# Patient Record
Sex: Male | Born: 1940 | Race: Black or African American | Hispanic: No | Marital: Single | Smoking: Former smoker
Health system: Southern US, Community
[De-identification: ages and names within clinical notes are randomized; demographics above are authoritative.]

## PROBLEM LIST (undated history)

## (undated) DIAGNOSIS — N289 Disorder of kidney and ureter, unspecified: Secondary | ICD-10-CM

## (undated) DIAGNOSIS — Z7289 Other problems related to lifestyle: Secondary | ICD-10-CM

## (undated) DIAGNOSIS — K922 Gastrointestinal hemorrhage, unspecified: Secondary | ICD-10-CM

## (undated) DIAGNOSIS — Z789 Other specified health status: Secondary | ICD-10-CM

## (undated) DIAGNOSIS — E7800 Pure hypercholesterolemia, unspecified: Secondary | ICD-10-CM

## (undated) DIAGNOSIS — R001 Bradycardia, unspecified: Secondary | ICD-10-CM

## (undated) DIAGNOSIS — F1090 Alcohol use, unspecified, uncomplicated: Secondary | ICD-10-CM

## (undated) DIAGNOSIS — I10 Essential (primary) hypertension: Secondary | ICD-10-CM

## (undated) DIAGNOSIS — D126 Benign neoplasm of colon, unspecified: Secondary | ICD-10-CM

## (undated) DIAGNOSIS — I639 Cerebral infarction, unspecified: Secondary | ICD-10-CM

## (undated) DIAGNOSIS — R55 Syncope and collapse: Secondary | ICD-10-CM

## (undated) HISTORY — DX: Essential (primary) hypertension: I10

## (undated) HISTORY — PX: OTHER SURGICAL HISTORY: SHX169

## (undated) HISTORY — DX: Pure hypercholesterolemia, unspecified: E78.00

## (undated) HISTORY — DX: Cerebral infarction, unspecified: I63.9

## (undated) HISTORY — DX: Benign neoplasm of colon, unspecified: D12.6

---

## 1998-05-09 ENCOUNTER — Emergency Department (HOSPITAL_COMMUNITY): Admission: EM | Admit: 1998-05-09 | Discharge: 1998-05-09 | Payer: Self-pay | Admitting: Emergency Medicine

## 1998-05-17 ENCOUNTER — Emergency Department (HOSPITAL_COMMUNITY): Admission: EM | Admit: 1998-05-17 | Discharge: 1998-05-17 | Payer: Self-pay | Admitting: Emergency Medicine

## 1998-07-19 ENCOUNTER — Ambulatory Visit (HOSPITAL_COMMUNITY): Admission: RE | Admit: 1998-07-19 | Discharge: 1998-07-19 | Payer: Self-pay | Admitting: Family Medicine

## 1999-11-07 HISTORY — PX: OTHER SURGICAL HISTORY: SHX169

## 2001-09-19 ENCOUNTER — Emergency Department (HOSPITAL_COMMUNITY): Admission: EM | Admit: 2001-09-19 | Discharge: 2001-09-19 | Payer: Self-pay | Admitting: Emergency Medicine

## 2001-09-19 ENCOUNTER — Encounter: Payer: Self-pay | Admitting: Emergency Medicine

## 2001-09-25 ENCOUNTER — Emergency Department (HOSPITAL_COMMUNITY): Admission: EM | Admit: 2001-09-25 | Discharge: 2001-09-25 | Payer: Self-pay

## 2001-10-24 ENCOUNTER — Ambulatory Visit (HOSPITAL_COMMUNITY): Admission: RE | Admit: 2001-10-24 | Discharge: 2001-10-24 | Payer: Self-pay | Admitting: Family Medicine

## 2001-12-31 ENCOUNTER — Ambulatory Visit (HOSPITAL_COMMUNITY): Admission: RE | Admit: 2001-12-31 | Discharge: 2001-12-31 | Payer: Self-pay | Admitting: Family Medicine

## 2002-04-11 ENCOUNTER — Encounter: Payer: Self-pay | Admitting: Emergency Medicine

## 2002-04-11 ENCOUNTER — Emergency Department (HOSPITAL_COMMUNITY): Admission: EM | Admit: 2002-04-11 | Discharge: 2002-04-11 | Payer: Self-pay | Admitting: Emergency Medicine

## 2002-04-20 ENCOUNTER — Emergency Department (HOSPITAL_COMMUNITY): Admission: EM | Admit: 2002-04-20 | Discharge: 2002-04-20 | Payer: Self-pay | Admitting: Emergency Medicine

## 2002-06-26 ENCOUNTER — Encounter: Payer: Self-pay | Admitting: Internal Medicine

## 2002-06-26 ENCOUNTER — Encounter: Payer: Self-pay | Admitting: Emergency Medicine

## 2002-06-26 ENCOUNTER — Inpatient Hospital Stay (HOSPITAL_COMMUNITY): Admission: EM | Admit: 2002-06-26 | Discharge: 2002-06-29 | Payer: Self-pay | Admitting: Emergency Medicine

## 2002-06-28 ENCOUNTER — Encounter: Payer: Self-pay | Admitting: Internal Medicine

## 2004-01-09 ENCOUNTER — Emergency Department (HOSPITAL_COMMUNITY): Admission: EM | Admit: 2004-01-09 | Discharge: 2004-01-09 | Payer: Self-pay | Admitting: Emergency Medicine

## 2004-06-08 ENCOUNTER — Emergency Department (HOSPITAL_COMMUNITY): Admission: EM | Admit: 2004-06-08 | Discharge: 2004-06-09 | Payer: Self-pay | Admitting: Emergency Medicine

## 2004-12-01 ENCOUNTER — Emergency Department (HOSPITAL_COMMUNITY): Admission: EM | Admit: 2004-12-01 | Discharge: 2004-12-01 | Payer: Self-pay | Admitting: Emergency Medicine

## 2006-09-09 ENCOUNTER — Emergency Department (HOSPITAL_COMMUNITY): Admission: EM | Admit: 2006-09-09 | Discharge: 2006-09-10 | Payer: Self-pay | Admitting: Podiatry

## 2006-12-14 ENCOUNTER — Emergency Department (HOSPITAL_COMMUNITY): Admission: EM | Admit: 2006-12-14 | Discharge: 2006-12-14 | Payer: Self-pay | Admitting: Emergency Medicine

## 2009-06-06 DIAGNOSIS — R001 Bradycardia, unspecified: Secondary | ICD-10-CM

## 2009-06-06 HISTORY — DX: Bradycardia, unspecified: R00.1

## 2009-06-09 ENCOUNTER — Inpatient Hospital Stay (HOSPITAL_COMMUNITY): Admission: EM | Admit: 2009-06-09 | Discharge: 2009-06-24 | Payer: Self-pay | Admitting: Emergency Medicine

## 2009-06-09 ENCOUNTER — Ambulatory Visit: Payer: Self-pay | Admitting: Infectious Disease

## 2009-06-10 ENCOUNTER — Encounter: Payer: Self-pay | Admitting: Infectious Disease

## 2009-06-10 ENCOUNTER — Ambulatory Visit: Payer: Self-pay | Admitting: Vascular Surgery

## 2009-06-23 ENCOUNTER — Ambulatory Visit: Payer: Self-pay | Admitting: Gastroenterology

## 2009-06-23 ENCOUNTER — Encounter (INDEPENDENT_AMBULATORY_CARE_PROVIDER_SITE_OTHER): Payer: Self-pay | Admitting: *Deleted

## 2009-08-09 ENCOUNTER — Encounter (INDEPENDENT_AMBULATORY_CARE_PROVIDER_SITE_OTHER): Payer: Self-pay | Admitting: *Deleted

## 2009-09-02 ENCOUNTER — Encounter (INDEPENDENT_AMBULATORY_CARE_PROVIDER_SITE_OTHER): Payer: Self-pay | Admitting: *Deleted

## 2009-09-02 ENCOUNTER — Ambulatory Visit: Payer: Self-pay | Admitting: Gastroenterology

## 2009-09-02 DIAGNOSIS — K219 Gastro-esophageal reflux disease without esophagitis: Secondary | ICD-10-CM | POA: Insufficient documentation

## 2009-09-02 DIAGNOSIS — K921 Melena: Secondary | ICD-10-CM

## 2009-09-02 DIAGNOSIS — I69959 Hemiplegia and hemiparesis following unspecified cerebrovascular disease affecting unspecified side: Secondary | ICD-10-CM | POA: Insufficient documentation

## 2009-09-06 ENCOUNTER — Telehealth: Payer: Self-pay | Admitting: Gastroenterology

## 2009-09-06 ENCOUNTER — Ambulatory Visit (HOSPITAL_COMMUNITY): Admission: RE | Admit: 2009-09-06 | Discharge: 2009-09-06 | Payer: Self-pay | Admitting: Gastroenterology

## 2009-09-06 ENCOUNTER — Ambulatory Visit: Payer: Self-pay | Admitting: Gastroenterology

## 2009-09-06 ENCOUNTER — Encounter: Payer: Self-pay | Admitting: Gastroenterology

## 2009-09-08 ENCOUNTER — Encounter: Payer: Self-pay | Admitting: Gastroenterology

## 2009-10-15 ENCOUNTER — Encounter (INDEPENDENT_AMBULATORY_CARE_PROVIDER_SITE_OTHER): Payer: Self-pay

## 2009-10-19 ENCOUNTER — Ambulatory Visit (HOSPITAL_COMMUNITY): Admission: RE | Admit: 2009-10-19 | Discharge: 2009-10-19 | Payer: Self-pay | Admitting: Gastroenterology

## 2009-10-19 ENCOUNTER — Telehealth (INDEPENDENT_AMBULATORY_CARE_PROVIDER_SITE_OTHER): Payer: Self-pay

## 2009-10-19 ENCOUNTER — Encounter (INDEPENDENT_AMBULATORY_CARE_PROVIDER_SITE_OTHER): Payer: Self-pay

## 2009-11-06 DIAGNOSIS — D126 Benign neoplasm of colon, unspecified: Secondary | ICD-10-CM

## 2009-11-06 HISTORY — DX: Benign neoplasm of colon, unspecified: D12.6

## 2009-11-29 ENCOUNTER — Ambulatory Visit: Payer: Self-pay | Admitting: Gastroenterology

## 2009-11-29 ENCOUNTER — Ambulatory Visit (HOSPITAL_COMMUNITY): Admission: RE | Admit: 2009-11-29 | Discharge: 2009-11-29 | Payer: Self-pay | Admitting: Gastroenterology

## 2009-11-30 ENCOUNTER — Encounter: Payer: Self-pay | Admitting: Gastroenterology

## 2010-12-06 NOTE — Procedures (Signed)
Summary: Colonoscopy  Patient: Isaiah [name] Note: All result statuses are Final unless otherwise noted.  Tests: (1) Colonoscopy (COL)   COL Colonoscopy           DONE     [facility]     [address] [place], [place]  [address]           COLONOSCOPY PROCEDURE REPORT           PATIENT:  Isaiah [name], Isaiah [name]  MR#:  [id number]     BIRTHDATE:  16-Aug-1941, 68 yrs. old  GENDER:  male           ENDOSCOPIST:  Judie Petit T. Russella Dar, MD, [facility]     Referred by:  Baltazar Najjar, M.D.           PROCEDURE DATE:  11/29/2009     PROCEDURE:  Colonoscopy with biopsy and snare polypectomy and  w/     submucosal injection     ASA CLASS:  Class II     INDICATIONS:  1) hematochezia           MEDICATIONS:   Fentanyl 75 mcg IV, Versed 5 mg IV           DESCRIPTION OF PROCEDURE:   After the risks benefits and     alternatives of the procedure were thoroughly explained, informed     consent was obtained.  Digital rectal exam was performed and     revealed no abnormalities.  The Pentax Colonoscope [id number]     endoscope was introduced through the anus and advanced to the     cecum, which was identified by both the appendix and ileocecal     valve, limited by poor preparation. Extensive risning and     suctioning was performed on insertion and withdrawl. The quality     of the prep was poor, using MoviPrep. The instrument was then     slowly withdrawn as the colon was fully examined.     <<PROCEDUREIMAGES>>           FINDINGS:  A sessile polyp was found in the cecum. It was 5 mm in     size. Polyp was snared without cautery. Retrieval was successful.     Four polyps were found in the ascending colon. They were 6 - 10 mm     in size. Polyps were snared without cautery. Retrieval was     successful.  The 2 smaller ascending polyps were lost in stool.  A     sessile polyp was found in the proximal transverse colon. It was     firm. It was 10 mm in size. Polyp was snared, then cauterized with  monopolar cautery. Retrieval was successful. A sessile polyp was     found in the descending colon. It was 7 mm in size. Polyp was     snared, then cauterized with monopolar cautery. Retrieval was     successful. Normal colon mucosa in the proximal transverse colon.     Submucosal injection of [place] ink. Tattoo of transverse colon in 2     locations adjacent to the polyp site. Nodular mucosa was found in     the rectum. It was friable and erythematous. Multiple biopsies     were obtained and sent to pathology. R/O mucosal prolaspe. This     was otherwise a normal examination of the colon.   Retroflexed     views in the rectum revealed no abnormalities.  The time to cecum     =  20  minutes. The scope was then withdrawn (time =  42  min)     from the patient and the procedure completed.           COMPLICATIONS:  None           ENDOSCOPIC IMPRESSION:     1) 5 mm sessile polyp in the cecum     2) 6 - 10 mm Four polyps in the ascending colon     3) 10 mm firm sessile polyp in the proximal transverse colon     4) 7 mm sessile polyp in the descending colon     5) Nodular musoca in the rectum. R/O mucosal prolapse           RECOMMENDATIONS:     1) No aspirin or NSAID's for 2 weeks     2) Await pathology results     3) Repeat Colonoscopy in 1-2 years, pending pathology review and     pts health status, with a more extensive bowel preparation.     4) Constipation regimen           Tallia Moehring T. Russella Dar, MD, [facility]           n.     eSIGNED:   Venita Lick. Jamell Opfer at 11/29/2009 10:22 AM           Westley Hummer, [id number]  Note: An exclamation mark (!) indicates a result that was not dispersed into the flowsheet. Document Creation Date: 11/29/2009 10:22 AM _______________________________________________________________________  (1) Order result status: Final Collection or observation date-time: 11/29/2009 10:09 Requested date-time:  Receipt date-time:  Reported date-time:  Referring  Physician:   Ordering Physician: Claudette Head [phone]) Specimen Source:  Source: Launa Grill Order Number: [id number] Lab site:

## 2010-12-06 NOTE — Letter (Signed)
Summary: Patient Notice- Polyp Results  [facility]  [address] [place], [place] [address]   Phone: [phone]  Fax: [phone]        November 30, 2009 MRN: [id number]    [facility] [address] [place], [place]  [address]    Dear Isaiah [name],  I am pleased to inform you that the colon polyp(s) removed during your recent colonoscopy was (were) found to be benign (no cancer detected) upon pathologic examination.   I recommend you have a repeat colonoscopy examination in 2 years to look for recurrent polyps, as having colon polyps increases your risk for having recurrent polyps or even colon cancer in the future.  Should you develop new or worsening symptoms of abdominal pain, bowel habit changes or bleeding from the rectum or bowels, please schedule an evaluation with either your primary care physician or with me.  Continue treatment plan as outlined the day of your exam.  Please call us if you are having persistent problems or have questions about your condition that have not been fully answered at this time.  Sincerely,  Isaiah Dare MD [facility]  This letter has been electronically signed by your physician.  Appended Document: Patient Notice- Polyp Results letter mailed to patient's home

## 2011-02-11 LAB — GLUCOSE, CAPILLARY
Glucose-Capillary: 134 mg/dL — ABNORMAL HIGH (ref 70–99)
Glucose-Capillary: 111 mg/dL — ABNORMAL HIGH (ref 70–99)
Glucose-Capillary: 111 mg/dL — ABNORMAL HIGH (ref 70–99)
Glucose-Capillary: 71 mg/dL (ref 70–99)
Glucose-Capillary: 79 mg/dL (ref 70–99)
Glucose-Capillary: 80 mg/dL (ref 70–99)
Glucose-Capillary: 82 mg/dL (ref 70–99)
Glucose-Capillary: 92 mg/dL (ref 70–99)
Glucose-Capillary: 93 mg/dL (ref 70–99)
Glucose-Capillary: 96 mg/dL (ref 70–99)

## 2011-02-11 LAB — BASIC METABOLIC PANEL
BUN: 4 mg/dL — ABNORMAL LOW (ref 6–23)
BUN: 5 mg/dL — ABNORMAL LOW (ref 6–23)
BUN: 11 mg/dL (ref 6–23)
BUN: 17 mg/dL (ref 6–23)
BUN: 17 mg/dL (ref 6–23)
BUN: 5 mg/dL — ABNORMAL LOW (ref 6–23)
CO2: 25 mEq/L (ref 19–32)
CO2: 28 mEq/L (ref 19–32)
CO2: 28 mEq/L (ref 19–32)
CO2: 20 mEq/L (ref 19–32)
CO2: 21 mEq/L (ref 19–32)
CO2: 23 mEq/L (ref 19–32)
CO2: 24 mEq/L (ref 19–32)
CO2: 25 mEq/L (ref 19–32)
Calcium: 8.9 mg/dL (ref 8.4–10.5)
Calcium: 9 mg/dL (ref 8.4–10.5)
Calcium: 9.1 mg/dL (ref 8.4–10.5)
Calcium: 9.2 mg/dL (ref 8.4–10.5)
Calcium: 9.2 mg/dL (ref 8.4–10.5)
Chloride: 104 mEq/L (ref 96–112)
Chloride: 105 mEq/L (ref 96–112)
Chloride: 104 mEq/L (ref 96–112)
Chloride: 106 mEq/L (ref 96–112)
Chloride: 109 mEq/L (ref 96–112)
Creatinine, Ser: 1.12 mg/dL (ref 0.4–1.5)
Creatinine, Ser: 1.07 mg/dL (ref 0.4–1.5)
Creatinine, Ser: 1.07 mg/dL (ref 0.4–1.5)
Creatinine, Ser: 1.29 mg/dL (ref 0.4–1.5)
Creatinine, Ser: 1.37 mg/dL (ref 0.4–1.5)
Creatinine, Ser: 1.86 mg/dL — ABNORMAL HIGH (ref 0.4–1.5)
GFR calc Af Amer: >60 mL/min (ref >60)
GFR calc Af Amer: 44 mL/min — ABNORMAL LOW (ref >60)
GFR calc Af Amer: >60 mL/min (ref >60)
GFR calc Af Amer: >60 mL/min (ref >60)
GFR calc non Af Amer: >60 mL/min (ref >60)
GFR calc non Af Amer: 52 mL/min — ABNORMAL LOW (ref >60)
GFR calc non Af Amer: >60 mL/min (ref >60)
GFR calc non Af Amer: >60 mL/min (ref >60)
GFR calc non Af Amer: >60 mL/min (ref >60)
Glucose, Bld: 103 mg/dL — ABNORMAL HIGH (ref 70–99)
Glucose, Bld: 106 mg/dL — ABNORMAL HIGH (ref 70–99)
Glucose, Bld: 95 mg/dL (ref 70–99)
Glucose, Bld: 54 mg/dL — ABNORMAL LOW (ref 70–99)
Glucose, Bld: 76 mg/dL (ref 70–99)
Glucose, Bld: 80 mg/dL (ref 70–99)
Glucose, Bld: 91 mg/dL (ref 70–99)
Glucose, Bld: 95 mg/dL (ref 70–99)
Potassium: 3.5 mEq/L (ref 3.5–5.1)
Potassium: 3.6 mEq/L (ref 3.5–5.1)
Potassium: 3.8 mEq/L (ref 3.5–5.1)
Potassium: 3.3 mEq/L — ABNORMAL LOW (ref 3.5–5.1)
Potassium: 3.8 mEq/L (ref 3.5–5.1)
Potassium: 4.8 mEq/L (ref 3.5–5.1)
Sodium: 135 mEq/L (ref 135–145)
Sodium: 138 mEq/L (ref 135–145)
Sodium: 136 mEq/L (ref 135–145)
Sodium: 136 mEq/L (ref 135–145)
Sodium: 141 mEq/L (ref 135–145)

## 2011-02-11 LAB — CULTURE, BLOOD (ROUTINE X 2)
Culture: NO GROWTH
Culture: NO GROWTH

## 2011-02-11 LAB — CBC
HCT: 30.9 % — ABNORMAL LOW (ref 39.0–52.0)
HCT: 31.1 % — ABNORMAL LOW (ref 39.0–52.0)
HCT: 33.2 % — ABNORMAL LOW (ref 39.0–52.0)
HCT: 28.7 % — ABNORMAL LOW (ref 39.0–52.0)
HCT: 29.8 % — ABNORMAL LOW (ref 39.0–52.0)
HCT: 30.4 % — ABNORMAL LOW (ref 39.0–52.0)
HCT: 31.6 % — ABNORMAL LOW (ref 39.0–52.0)
HCT: 31.7 % — ABNORMAL LOW (ref 39.0–52.0)
HCT: 38.4 % — ABNORMAL LOW (ref 39.0–52.0)
HCT: 45.7 % (ref 39.0–52.0)
HCT: 48.2 % (ref 39.0–52.0)
HCT: 54.2 % — ABNORMAL HIGH (ref 39.0–52.0)
HCT: 56.2 % — ABNORMAL HIGH (ref 39.0–52.0)
HCT: 57.2 % — ABNORMAL HIGH (ref 39.0–52.0)
Hemoglobin: 10.6 g/dL — ABNORMAL LOW (ref 13.0–17.0)
Hemoglobin: 10.6 g/dL — ABNORMAL LOW (ref 13.0–17.0)
Hemoglobin: 10.2 g/dL — ABNORMAL LOW (ref 13.0–17.0)
Hemoglobin: 10.4 g/dL — ABNORMAL LOW (ref 13.0–17.0)
Hemoglobin: 11 g/dL — ABNORMAL LOW (ref 13.0–17.0)
Hemoglobin: 13.2 g/dL (ref 13.0–17.0)
Hemoglobin: 17.2 g/dL — ABNORMAL HIGH (ref 13.0–17.0)
Hemoglobin: 17.8 g/dL — ABNORMAL HIGH (ref 13.0–17.0)
Hemoglobin: 18.4 g/dL — ABNORMAL HIGH (ref 13.0–17.0)
Hemoglobin: 9.8 g/dL — ABNORMAL LOW (ref 13.0–17.0)
MCHC: 34.1 g/dL (ref 30.0–36.0)
MCHC: 34.2 g/dL (ref 30.0–36.0)
MCHC: 34.2 g/dL (ref 30.0–36.0)
MCHC: 33.8 g/dL (ref 30.0–36.0)
MCHC: 33.9 g/dL (ref 30.0–36.0)
MCHC: 33.9 g/dL (ref 30.0–36.0)
MCHC: 34 g/dL (ref 30.0–36.0)
MCHC: 34.2 g/dL (ref 30.0–36.0)
MCHC: 34.3 g/dL (ref 30.0–36.0)
MCHC: 34.3 g/dL (ref 30.0–36.0)
MCHC: 34.4 g/dL (ref 30.0–36.0)
MCHC: 34.8 g/dL (ref 30.0–36.0)
MCV: 89.7 fL (ref 78.0–100.0)
MCV: 90.1 fL (ref 78.0–100.0)
MCV: 88.5 fL (ref 78.0–100.0)
MCV: 89.8 fL (ref 78.0–100.0)
MCV: 90.1 fL (ref 78.0–100.0)
MCV: 90.5 fL (ref 78.0–100.0)
MCV: 90.8 fL (ref 78.0–100.0)
MCV: 91.1 fL (ref 78.0–100.0)
Platelets: 180 10*3/uL (ref 150–400)
Platelets: 217 10*3/uL (ref 150–400)
Platelets: 106 10*3/uL — ABNORMAL LOW (ref 150–400)
Platelets: 110 10*3/uL — ABNORMAL LOW (ref 150–400)
Platelets: 114 10*3/uL — ABNORMAL LOW (ref 150–400)
Platelets: 117 10*3/uL — ABNORMAL LOW (ref 150–400)
Platelets: 117 10*3/uL — ABNORMAL LOW (ref 150–400)
Platelets: 120 10*3/uL — ABNORMAL LOW (ref 150–400)
Platelets: 126 10*3/uL — ABNORMAL LOW (ref 150–400)
Platelets: 148 10*3/uL — ABNORMAL LOW (ref 150–400)
Platelets: 192 10*3/uL (ref 150–400)
RBC: 3.43 MIL/uL — ABNORMAL LOW (ref 4.22–5.81)
RBC: 3.48 MIL/uL — ABNORMAL LOW (ref 4.22–5.81)
RBC: 3.2 MIL/uL — ABNORMAL LOW (ref 4.22–5.81)
RBC: 4.34 MIL/uL (ref 4.22–5.81)
RBC: 4.45 MIL/uL (ref 4.22–5.81)
RBC: 4.83 MIL/uL (ref 4.22–5.81)
RBC: 5.79 MIL/uL (ref 4.22–5.81)
RBC: 5.95 MIL/uL — ABNORMAL HIGH (ref 4.22–5.81)
RBC: 6.2 MIL/uL — ABNORMAL HIGH (ref 4.22–5.81)
RBC: 6.32 MIL/uL — ABNORMAL HIGH (ref 4.22–5.81)
RDW: 13.6 % (ref 11.5–15.5)
RDW: 13.9 % (ref 11.5–15.5)
RDW: 13.3 % (ref 11.5–15.5)
RDW: 13.5 % (ref 11.5–15.5)
RDW: 13.5 % (ref 11.5–15.5)
RDW: 13.7 % (ref 11.5–15.5)
RDW: 13.8 % (ref 11.5–15.5)
RDW: 13.9 % (ref 11.5–15.5)
RDW: 13.9 % (ref 11.5–15.5)
RDW: 14 % (ref 11.5–15.5)
RDW: 14 % (ref 11.5–15.5)
RDW: 14 % (ref 11.5–15.5)
RDW: 14.1 % (ref 11.5–15.5)
RDW: 14.4 % (ref 11.5–15.5)
WBC: 8.4 10*3/uL (ref 4.0–10.5)
WBC: 11.6 10*3/uL — ABNORMAL HIGH (ref 4.0–10.5)
WBC: 13.6 10*3/uL — ABNORMAL HIGH (ref 4.0–10.5)
WBC: 5.7 10*3/uL (ref 4.0–10.5)
WBC: 5.9 10*3/uL (ref 4.0–10.5)
WBC: 6.4 10*3/uL (ref 4.0–10.5)
WBC: 6.7 10*3/uL (ref 4.0–10.5)

## 2011-02-11 LAB — PREPARE FRESH FROZEN PLASMA

## 2011-02-11 LAB — PROTIME-INR
INR: 1.2 (ref 0.00–1.49)
INR: 1.2 (ref 0.00–1.49)
INR: 1.1 (ref 0.00–1.49)
INR: 1.2 (ref 0.00–1.49)
INR: 2 — ABNORMAL HIGH (ref 0.00–1.49)
Prothrombin Time: 14.2 seconds (ref 11.6–15.2)
Prothrombin Time: 15.2 seconds (ref 11.6–15.2)
Prothrombin Time: 15.2 seconds (ref 11.6–15.2)
Prothrombin Time: 22.6 seconds — ABNORMAL HIGH (ref 11.6–15.2)
Prothrombin Time: 27.8 seconds — ABNORMAL HIGH (ref 11.6–15.2)

## 2011-02-11 LAB — BLOOD GAS, ARTERIAL
Bicarbonate: 22 mEq/L (ref 20.0–24.0)
O2 Saturation: 98.3 %
Patient temperature: 98.6
TCO2: 23.2 mmol/L (ref 0–100)

## 2011-02-11 LAB — CK TOTAL AND CKMB (NOT AT ARMC): Relative Index: 0.6 (ref 0.0–2.5)

## 2011-02-11 LAB — HEMOGLOBIN A1C
Hgb A1c MFr Bld: 5.1 % (ref 4.6–6.1)
Mean Plasma Glucose: 100 mg/dL

## 2011-02-11 LAB — DIFFERENTIAL
Basophils Absolute: 0 10*3/uL (ref 0.0–0.1)
Basophils Relative: 0 % (ref 0–1)
Eosinophils Absolute: 0 10*3/uL (ref 0.0–0.7)
Eosinophils Relative: 0 % (ref 0–5)
Monocytes Absolute: 0.4 10*3/uL (ref 0.1–1.0)

## 2011-02-11 LAB — LIPID PANEL
Cholesterol: 220 mg/dL — ABNORMAL HIGH (ref 0–200)
LDL Cholesterol: 157 mg/dL — ABNORMAL HIGH (ref 0–99)
VLDL: 25 mg/dL (ref 0–40)

## 2011-02-11 LAB — TYPE AND SCREEN

## 2011-02-11 LAB — COMPREHENSIVE METABOLIC PANEL
AST: 57 U/L — ABNORMAL HIGH (ref 0–37)
Albumin: 2.8 g/dL — ABNORMAL LOW (ref 3.5–5.2)
Albumin: 4.3 g/dL (ref 3.5–5.2)
Alkaline Phosphatase: 92 U/L (ref 39–117)
BUN: 16 mg/dL (ref 6–23)
BUN: 20 mg/dL (ref 6–23)
CO2: 20 mEq/L (ref 19–32)
Chloride: 105 mEq/L (ref 96–112)
Creatinine, Ser: 1.29 mg/dL (ref 0.4–1.5)
GFR calc Af Amer: 32 mL/min — ABNORMAL LOW (ref >60)
Potassium: 3.7 mEq/L (ref 3.5–5.1)
Potassium: 4.2 mEq/L (ref 3.5–5.1)
Total Bilirubin: 0.6 mg/dL (ref 0.3–1.2)
Total Protein: 5.8 g/dL — ABNORMAL LOW (ref 6.0–8.3)

## 2011-02-11 LAB — HEPATIC FUNCTION PANEL
Alkaline Phosphatase: 65 U/L (ref 39–117)
Bilirubin, Direct: 0.3 mg/dL (ref 0.0–0.3)
Indirect Bilirubin: 1.2 mg/dL — ABNORMAL HIGH (ref 0.3–0.9)
Total Bilirubin: 1.5 mg/dL — ABNORMAL HIGH (ref 0.3–1.2)

## 2011-02-11 LAB — CARDIAC PANEL(CRET KIN+CKTOT+MB+TROPI)
CK, MB: 1.5 ng/mL (ref 0.3–4.0)
Relative Index: 0.4 (ref 0.0–2.5)
Relative Index: 0.4 (ref 0.0–2.5)
Total CK: 153 U/L (ref 7–232)
Total CK: 297 U/L — ABNORMAL HIGH (ref 7–232)
Total CK: 3021 U/L — ABNORMAL HIGH (ref 7–232)
Total CK: 381 U/L — ABNORMAL HIGH (ref 7–232)
Troponin I: 0.01 ng/mL (ref 0.00–0.06)
Troponin I: 0.02 ng/mL (ref 0.00–0.06)
Troponin I: 0.02 ng/mL (ref 0.00–0.06)

## 2011-02-11 LAB — ERYTHROPOIETIN: Erythropoietin: 13 m[IU]/mL (ref 2.6–34.0)

## 2011-02-11 LAB — ELECTROLYTE PANEL
CO2: 21 mEq/L (ref 19–32)
Sodium: 142 mEq/L (ref 135–145)

## 2011-02-11 LAB — ETHANOL: Alcohol, Ethyl (B): 95 mg/dL — ABNORMAL HIGH (ref 0–10)

## 2011-02-11 LAB — CREATININE, URINE, RANDOM: Creatinine, Urine: 81.5 mg/dL

## 2011-02-11 LAB — URINALYSIS, ROUTINE W REFLEX MICROSCOPIC
Leukocytes, UA: NEGATIVE
Nitrite: NEGATIVE
Specific Gravity, Urine: 1.008 (ref 1.005–1.030)
pH: 5.5 (ref 5.0–8.0)

## 2011-02-11 LAB — URINE MICROSCOPIC-ADD ON

## 2011-02-11 LAB — SODIUM, URINE, RANDOM: Sodium, Ur: 31 mEq/L

## 2011-02-11 LAB — MRSA PCR SCREENING: MRSA by PCR: NEGATIVE

## 2011-02-11 LAB — TSH: TSH: 1.137 u[IU]/mL (ref 0.350–4.500)

## 2011-02-11 LAB — ABO/RH: ABO/RH(D): O POS

## 2011-02-11 LAB — RAPID URINE DRUG SCREEN, HOSP PERFORMED: Tetrahydrocannabinol: NOT DETECTED

## 2011-03-21 NOTE — Consult Note (Signed)
Isaiah [name], Isaiah [name] NO.:  [id number]   MEDICAL RECORD NO.:  [id number]          PATIENT TYPE:  INP   LOCATION:  1826                         FACILITY:  [facility]   PHYSICIAN:  Melvyn Novas, M.D.  DATE OF BIRTH:  05/14/1941   DATE OF CONSULTATION:  06/09/2009  DATE OF DISCHARGE:                                 CONSULTATION   CHIEF COMPLAINT:  Right cerebrovascular accident.   HISTORY OF PRESENT ILLNESS:  This is a 70 year old African American male  who presents to [facility] ED with the chief complaint of right CVA.  The patient was severely intoxicated with an alcohol level of 95 at the  present time.  The majority of the history was obtained by sister, a  sister who was not present prior to ED arrival.  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  The patient was drinking with his  friends last night, last known seen was approximately 11:00 last night.  According to his sister his friends stated that he was not acting  correctly at that time, staring with a blank stare.  EMS was not called at that time.  It is unclear between that time and  approximately 10:33 when EMS was called what actions took place.  The  patient was found still on the porch in the morning, this morning,  June 09, 2009, not acting correctly at that time.  At that time EMS was called and the patient was transported to [facility] ED.  The patient was found to have a flaccid left arm and leg on  arrival of EMS and not answering questions appropriately.  Upon entering  the ED at [facility] the patient's alcohol level was found to  be 95.  The patient was answering some questions with short answers and remained  to have a left hemiparesis.  CT scan was obtained at 11:50 on June 09, 2009, which showed an acute right anterior cerebral artery distribution  infarct with extension into the right MCA  distribution.    Due to the unknown time of onset no code stroke was called.  The patient was last seen normal at approximately June 08, 2009.  NIH  stroke scale was obtained during physical exam which was a 16, however,  the patient was not able to take part much in the NIH stroke scale.  Neurology consult was called and the patient was seen at 1400 hours.  No  t-PA was given.   PAST MEDICAL HISTORY:  Questionable for hypertension.  The patient does  not have a PCP.   STROKE RISK FACTORS:  Hypertension and alcohol abuse.   MEDICATIONS:  No known medications taken at this time.   ALLERGIES:  No known allergies at this time.   FAMILY HISTORY:  One sister has hypertension, one sister has diabetes.  Mother has hypertension, CVA.   SOCIAL HISTORY:  The patient is a retired Sales executive.  He does drink  heavily.  He walks and does not drive  to his locations.  He lives in a  boarding house.   REVIEW OF SYSTEMS:  CARDIOVASCULAR:  Hypertension.   PHYSICAL EXAMINATION:  VITAL SIGNS:  Temperature is 97.6, blood pressure  191/105, heart rate 92, respiration 16, O2 saturations 98%.  HEENT:  Left eye is sclerotic and has a cataract.  Right eye pupil is  equal, round and reactive accommodating to light and constricting 3 to 2  mm.  NECK:  Negative for bruits and supple.  LUNGS:  Clear to auscultation bilaterally.  No rhonchi or wheezing.  CARDIOVASCULAR:  Shows regular rate and rhythm.  S1, S2.  No murmurs or  bruits.  ABDOMEN:  Nontender.  Is distended with ascites.  NEUROLOGICAL:  The patient squeezes both eyelids closed to request.  Extraocular muscles are intact in all six cardinal gazes.    He does blink to threat on the right but not the left.  His right  pupil constricts 3 mm, 2 mm with light.  He has a positive left facial  droop and is able to tell me that his left face has decreased sensation  than his right.  It should be noted that during this exam the patient is  still  highly intoxicated and does not take part in majority of my  physical exam.  It was obvious that the patient was unable to hold his left arm or leg  against gravity and is flaccid.  The patient's deep tendon reflexes throughout are 2+, 1+ at the Achilles  and patella level  and he has equivocal toes.  The patient was unable to take part in the finger-to-nose or heel-to-  shin.  He was unable to take part in the majority of sensation exam.  The patient was able to squeeze my hand on the right but not able  squeeze my hand with his  left.   TEST RESULTS:  Sodium is 144, potassium 4.2, chloride 105, CO2 20, BUN  16, creatinine is high at 2.45, glucose 112, hemoglobin and hematocrit  is 19.2 and 57.2.  White blood cell count is 11.6, platelets 192.  PT is  13, INR 1.0, PTT is 32.  EtOH level is 95, ammonia is 26.  Chest x-ray  pending.  CT as stated above.   ASSESSMENT:  At this time is left fully developed hemiparesis secondary  to CT confirmed left anterior/left MCA (middle cerebral artery)  distribution infarct.  The patient is alcohol intoxicated at this time and is in need of fluid  infusion and DT (delirium tremens) protocol.  Mostly I believe this is a  cardioembolic stroke in the right anterior MCA /full ACA distribution  and we ordered a 2-D echo to evaluate for ETOH/ HTN related  cardiomyopathy.   PLAN:  At this time is phenobarb protocol 50 mg b.i.d. IV or p.o. for  delirium tremens prevention.  Will obtain an MRI/MRA of head,  2-D echo,  carotid Dopplers in the next 24 hours.   Thank you very much for this consultation.  Dr. Richardean Chimera is at the  bedside and spoke with the only relative present, his siter. She gave  information - Patient has no PCP, drinks since teenage and lives in a  Boarding house in  [place]. Was drinking with neighbors on the fron porch last night,  foun there this morning slumped to one side. Still intoxicated, he can  not state time of symptom's  onset neither was he observed by anyone not  intoxicated.   This patient  will be followed by Stroke M.D., Dr. Delia Heady.     ______________________________  Felicie Morn, PA-C      Melvyn Novas, M.D.  Electronically Signed    DS/MEDQ  D:  06/09/2009  T:  06/09/2009  Job:  [id number]   cc:   Annye Rusk, MD

## 2011-03-21 NOTE — Discharge Summary (Signed)
Isaiah [name], Isaiah [name] NO.:  [id number]   MEDICAL RECORD NO.:  [id number]          PATIENT TYPE:  INP   LOCATION:  2604                         FACILITY:  [facility]   PHYSICIAN:  Isaiah Lav, MD  DATE OF BIRTH:  [birth year]   DATE OF ADMISSION:  06/09/2009  DATE OF DISCHARGE:  06/24/2009                               DISCHARGE SUMMARY   DISCHARGE DIAGNOSES:  1. Acute ischemic stroke.  2. Gastrointestinal bleed.  3. Hypertension.  4. Bradycardia.  5. Acute kidney injury.  6. Ethanol abuse.  7. Hyperlipidemia.   DISCHARGE MEDICATION:  1. Aspirin 325 mg p.o. daily.  2. Folic acid 1 mg p.o. daily.  3. Keppra 250 mg p.o. b.i.d.  4. Lisinopril 5 mg p.o. daily.  5. Protonix 40 mg p.o. daily.  6. Zocor 20 mg p.o. daily.  7. Multivitamin 1 tab daily.   DISPOSITION AND FOLLOW-UP:  1. Follow up with [facility] with Isaiah [name] on      July 15, 2009 at 11:30 a.m.  The contact number for that      appointment is [phone].  2. Follow up with primary care Isaiah [name] within [place], the      skilled nursing facility to which Isaiah [name] will be discharged.  3. Follow up with [facility] for colonoscopy.  This      appointment will be made, and he will be contacted with the      appointment time in the future.   PROCEDURES PERFORMED:  1. CT head without contrast showed the following:  Acute right      anterior cerebral artery infarct.  2. MRI of the brain showed acute right anterior cerebral artery      infarct as well as watershed infarct of the ACA/PCA areas.  3. MRA of the circle of Willis showed focal narrowing of the right      ACA.  4. Carotid Dopplers showed mild atherosclerotic disease, but no      significant stenosis.  5. Transthoracic echocardiogram showed no intracardiac thrombus.   CONSULTATIONS:  1. [facility].  This team was called upon because of a      painless hematochezia that  developed in the patient during his      admission.  They felt that it was most likely due to diverticulosis      given his age and symptoms.  After his H and H stabilized, they      felt comfortable with scheduling him for an outpatient colonoscopy      to evaluate for diverticulosis at 3-5 weeks.  2. [facility].  This team was consulted because      of symptomatic bradycardia that occurred.  The patient experienced      2 of these episodes during the hospitalization during which his      heart rate dropped into the 30s and 40s.  He became less      responsive.  Both times, however, he came out of the bradycardia      without intervention, although atropine was used for the  first      event.  The [facility] team suggested that these episodes      may be due to his phenobarbital that he had been prescribed on      admission for prevention of alcohol withdrawl .  In any case, their      final plan was to place a Holter monitor on the patient, and have      him follow up with them in their clinic on September 9 at the time      described above.   ADMITTING H AND P:  Isaiah [name] is a 70 year old African American  male with a past medical history of hypertension, hyperlipidemia,  possible but unclear CVA 1-2 years ago with residual left-sided  weakness, who presented with a new facial droop.  The patient was an  extremely poor historian, and most of the history is taken from his  sister who also knows little about the situation.  Basically the day  prior to admission he had been drinking alcohol out on his porch, and  during that evening he was found unresponsive by a friend of his with  whom he lives.  The friend left him out on the porch overnight, and in  the morning found Isaiah [name] to be completely unresponsive and at that  point called EMS.  He was then transferred to the [facility] Emergency  Department, and the CT scan mentioned above revealed an  acute infarct.   HOSPITAL COURSE BY PROBLEM:  1. Acute ischemic infarct.  This problem was discovered immediately      upon admission based on imaging studies.  He was subsequently      started on a regimen of aspirin, a statin, and Keppra for seizure      prophylaxis.  For a time he was placed on Warfarin because of      thought that a cardioembolic source may have caused the stroke.      However, in looking back at the records there was no evidence of      sustained arrhythmia, 2D ECHO was not suggestive of embolic sourse      and Warfarin was discontinued.  Other workup included carotid      Dopplers which showed mild atherosclerotic disease and 2D echo      which showed no obvious cardioembolic source for the multifocal      stroke.  He was thus discharged on full dose 325 aspirin, Zocor and      Keppra as secondary prophylaxis.  2. GI bleed.  As mentioned during the hospitalization, the patient was      put on Warfarin temporarily.  His INR became between 2 and 3 at      which point he started to develop painless bright red blood per      rectum.  His hemoglobin dropped from an admission hemoglobin of 19      down to the nadir of 10.  However, he was never transfused because      it was deemed that he still had adequate oxygenation ability, and      there is no evidence of end organ damage secondary to the bleeding.      GI consulted on the situation, and they felt that the bleeding was      most consistent with diverticulosis given that it was bright red      blood per rectum, painless, and in a 70 year old man, and as they  suggested the bleeding did spontaneously stop and the patient was      scheduled for an outpatient colonoscopy upon discharge.  3. Bradycardia.  The patient experienced 2 episodes of symptomatic      bradycardia during his admission.  The first occurred 5 days after      admission when he dropped into the 30s and 40s with his heart rate.      He became  unresponsive during the episode requiring atropine to      bring his heart rate back up.  It was thought at the time that the      phenobarbital that he had been taking could have contributed to the      bradycardia.  He had no further episodes until 10 days following      that one during which a similar event occurred.  He dropped his      heart rate into the 40s, became less responsive, but this time      spontaneously went back into regular rate and rhythm.  [facility]      Cardiology consulted on the case, and felt that a pacemaker was not      indicated in this patient given that he is kind of now chronically      debilitated and that other than his two brief episodes he had been      otherwise withotu symptoms  Plan at this time is to place a Holter      monitor on the patient, and check back in with him in approximately      a month from now regarding the symptoms.  4. Hyperlipidemia.  The patient was started on and maintained on Zocor      20 mg p.o. daily during the hospitalization.  5. Hypertension.  Initially the patient was allowed to be permissively      hypertensive in the setting of an acute ischemic stroke, so that he      could maintain cerebral perfusion.  After the initial week or so      hypertension was chased a bit more aggressively, and on discharge      he was on lisinopril 5 mg p.o. daily as his hypertensive regimen      which seemed to bring it down to normal limits.  6. Alcohol abuse.  The patient had a significant history of heavy      liquor ingestion daily on admission.  He was started on CIWA      observation protocol, and received prophylactic phenobarbital to      prevent alcohol withdrawal.  The patient will be transferred to a      SNF, and abstinence from alcohol will be mandatory for this patient      as it may have contributed to his stroke.  7. Rhabdomyolysis.  The patient presented with a highly elevated CK in      the setting of severe  dehydration and apparently sitting out on a      porch for at least 24 hours.  We think simply the heat and the      immobility and the dehydration contributed to this rhabdomyolysis,      and after aggressive fluid administration during the first few days      of the hospitalization of the patient both his kidney function and      his CK came down to within normal limits.  8. Acute kidney injury.  Like I stated above, the etiology  is most      likely dehydration.  Aggressive fluid administration during the      first few days of hospitalization caused his creatinine to drop      from admission of 3 to within normal limits at discharge.   Dictated by Lorelle Gibbs MS IV      Elby Showers, MD  Electronically Signed      Isaiah Lav, MD  Electronically Signed    CW/MEDQ  D:  06/24/2009  T:  06/24/2009  Job:  [id number]   cc:   [facility]  [facility]

## 2011-03-24 NOTE — Discharge Summary (Signed)
NAMENHAT, HEARNE                         ACCOUNT NO.:  [id number]   MEDICAL RECORD NO.:  [id number]                   PATIENT TYPE:  INP   LOCATION:  4737                                 FACILITY:  [facility]   PHYSICIAN:  Liliane Channel, M.D.                        DATE OF BIRTH:  [birth year]   DATE OF ADMISSION:  06/26/2002  DATE OF DISCHARGE:  06/29/2002                                 DISCHARGE SUMMARY   DISCHARGE DIAGNOSES:  1. Syncope, recurrent.  EEG done in November 14, 2001, and echo done in     February of 2003, showed an ejection fraction of 50 to 55%, akinesis to     the baseline mid inferior wall, right side was not well visualized.  2. Hypertension.  3. Hyperlipidemia.  4. Alcohol abuse, abstinence for the past one month.  5. Status post colonoscopy in 2001.  Benign polyps.  6. History of tinnitus fascia of the left leg.  7. Status post fixation.   DISCHARGE MEDICATIONS:  1. Hydrochlorothiazide 25 mg one p.o. q.d.  2. Vasotec 20 mg one p.o. q.d.  3. One aspirin.  4. Zocor 20 mg one p.o. q.d.   FOLLOW UP:  He is to follow up with his regular continuity doctor, [place]  R. Rankins, M.D. at [facility] on [address].   PROCEDURE:  ABIs not performed.  Noninvasive exercise stress test which  showed no change in EKG, no chest pain, and was clinically negative.  Images  are pending.  Portable chest x-ray shows no active disease.  Adenosine  Cardiolite which showed no evidence of hypertrophy, induced myocardial  ischemia, no segmental wall motion abnormality.  Ejection fraction was 52%.  Consultation was done with cardiology who with the significant Cardiolite  which was negative.  If he had further syncope, the patient to go to acquire  an EP study and a tilt-table test.   HISTORY OF PRESENT ILLNESS:  A 70 year old male with history of  hypertension, hyperlipidemia, adenopathy, alcohol abuse, with recurrent  syncope.  Last syncopal episode was November of 2002.  EEG was  negative.  He  presented with a fallout while waiting for a bus in the morning.  He had had  breakfast.  He had no difficulty, no seizure activity, no confusion, and had  one episode of chest pain one day prior to it.  A pressure-type of sensation  which lasted 30 minutes, nonradiating and resolved spontaneously.  No  history of any fever, chills, and no chronic headaches or orthopnea present  and has had symptoms of reflux.  Also claudication.   ALLERGIES:  No known drug allergies.   PAST MEDICAL HISTORY:  History of recurrent syncope, hypertension,  hyperlipidemia, history of traumatic fracture of left leg.   MEDICATIONS:  1. Enalapril 20 mg q.d.  2. Clonidine 0.2 mg q.d.  3. Aspirin 325 mg q.d.  4. Silva 20 mg q.d.   SUBSTANCE ABUSE:  He is a smoker of 1 pack per day for the past 45 years.  Alcohol; stopped one month ago.  Cocaine; never.   SOCIAL HISTORY:  He is a single individual with self-pay.   FAMILY HISTORY:  His mother died at 70 secondary to nephrectomy due cancer  of kidney.  Father died at 1 of acute myocardial infarction.  He has one  sister with a pacemaker and a brother who died of brain cancer.   PHYSICAL EXAMINATION:  VITAL SIGNS:  Pulse 57, blood pressure 126/66,  temperature 98.2, respiratory rate 18, O2 saturations 92% on room air.  Orthostatics when lying down; blood pressure 132/79 with pulse 64; standing  130/80 pulse 62.  GENERAL:  He is a well-nourished individual.  In no acute distress.  HEENT:  Eyes; no icterus and PERRLA.  EENT; oropharynx is benign.  NECK:  Supple, no JVD.  Tympanic membranes clear with no bruits.  LUNGS:  Good air entry.  HEART:  Distant heart sounds, but bradycardia.  ABDOMEN:  Soft, nondistended, and nontender.  No organomegaly.  Bowel sounds  are heard.  LYMPH:  No lymph nodes palpable.  NEUROLOGY:  Intact.   LABORATORY DATA:  Sodium 138, potassium 4.0, chloride 111, carbon dioxide  23, BUN 15, creatinine 1.1.  Calcium  9.6, protein 6.8, albumin 3.9, alkaline  phosphatase 78, SGOT 22, SGPT 18, lipase 30.  WBC count 5.7, hemoglobin  16.2, platelets 171.  MCV 88.  ANC 30.7.   His head CT noncontrast was negative.  EKG; normal sinus rhythm, rate of 60,  normal axis, no T wave inversion nor ST elevation.  STD screen was negative.   HOSPITAL COURSE:  1. Syncope.  The patient had three episodes of syncope in the past,     developed in February of 2003, showed hypokinesis in patient which showed     cardiac in origin. His D-dimer and ABG was negative.  It does not seem to     be a pulmonary embolism.  We got a cardiology consult who did Adenosine     Cardiolite which was negative.  Due to finding, it was decided syncope     was probably due to Clonidine and we took him off Clonidine.  2. Hypertension.  His blood pressure was slightly increasing, I started him     on hydrochlorothiazide 25 mg q.d. and it was kept at that.  3. Hyperlipidemia.  His lipid profile was done which showed cholesterol of     143, triglycerides 206, HDL cholesterol 37, total cholesterol HDL ratio     3.9, VLDL cholesterol 41, MLDL cholesterol 55.  The patient was put on     Zocor 20 mg p.o. q.d.    DISCHARGE LABORATORY DATA:  Sodium 138, potassium 3.5, chloride 102, carbon  dioxide 29, glucose 87, BUN 14, creatinine 1.2, and calcium 10.1.  WBC 6.2,  RBC 5.96, hemoglobin 17.7, MCV 91, and RDW 13.6 on discharge.   FOLLOW UP:  The patient is to see his primary care physician in follow-up  who is Dr. Barbaraann Barthel of [facility].                                               Liliane Channel, M.D.    Hadley Pen  D:  07/08/2002  T:  07/09/2002  Job:  [id number]   cc:   Alvester Morin, M.D.   Thomas A. Patty Sermons, M.D.

## 2011-12-13 ENCOUNTER — Encounter: Payer: Self-pay | Admitting: Gastroenterology

## 2011-12-20 ENCOUNTER — Encounter: Payer: Self-pay | Admitting: Gastroenterology

## 2011-12-29 ENCOUNTER — Ambulatory Visit (AMBULATORY_SURGERY_CENTER): Payer: Medicaid Other

## 2011-12-29 DIAGNOSIS — Z1211 Encounter for screening for malignant neoplasm of colon: Secondary | ICD-10-CM

## 2011-12-29 DIAGNOSIS — Z860100 Personal history of colon polyps, unspecified: Secondary | ICD-10-CM

## 2011-12-29 MED ORDER — PEG-KCL-NACL-NASULF-NA ASC-C 100 G PO SOLR: 1.0000 | Freq: Once | ORAL | Status: DC

## 2011-12-29 MED ORDER — POLYETHYLENE GLYCOL 3350 17 G PO PACK: 255.0000 g | PACK | Freq: Once | ORAL | Status: AC

## 2011-12-29 MED ORDER — BISACODYL 5 MG PO TBEC: 5.0000 mg | DELAYED_RELEASE_TABLET | Freq: Once | ORAL | Status: DC

## 2012-01-12 ENCOUNTER — Other Ambulatory Visit: Payer: Medicaid Other | Admitting: Gastroenterology

## 2012-01-12 ENCOUNTER — Telehealth: Payer: Self-pay | Admitting: Gastroenterology

## 2012-01-12 ENCOUNTER — Encounter (HOSPITAL_COMMUNITY): Payer: Self-pay | Admitting: *Deleted

## 2012-01-12 ENCOUNTER — Emergency Department (HOSPITAL_COMMUNITY)
Admission: EM | Admit: 2012-01-12 | Discharge: 2012-01-13 | Disposition: A | Discharge status: Home / Self Care | Payer: Medicare Other | Attending: Emergency Medicine | Admitting: Emergency Medicine

## 2012-01-12 ENCOUNTER — Emergency Department (HOSPITAL_COMMUNITY): Payer: Medicare Other

## 2012-01-12 DIAGNOSIS — E7800 Pure hypercholesterolemia, unspecified: Secondary | ICD-10-CM | POA: Insufficient documentation

## 2012-01-12 DIAGNOSIS — Z79899 Other long term (current) drug therapy: Secondary | ICD-10-CM | POA: Insufficient documentation

## 2012-01-12 DIAGNOSIS — K5900 Constipation, unspecified: Secondary | ICD-10-CM | POA: Insufficient documentation

## 2012-01-12 DIAGNOSIS — I10 Essential (primary) hypertension: Secondary | ICD-10-CM | POA: Insufficient documentation

## 2012-01-12 DIAGNOSIS — R110 Nausea: Secondary | ICD-10-CM | POA: Insufficient documentation

## 2012-01-12 DIAGNOSIS — Z8679 Personal history of other diseases of the circulatory system: Secondary | ICD-10-CM | POA: Insufficient documentation

## 2012-01-12 HISTORY — DX: Disorder of kidney and ureter, unspecified: N28.9

## 2012-01-12 HISTORY — DX: Bradycardia, unspecified: R00.1

## 2012-01-12 HISTORY — DX: Other problems related to lifestyle: Z72.89

## 2012-01-12 HISTORY — DX: Other specified health status: Z78.9

## 2012-01-12 HISTORY — DX: Alcohol use, unspecified, uncomplicated: F10.90

## 2012-01-12 LAB — CBC
MCH: 30.5 pg (ref 26.0–34.0)
MCHC: 36.1 g/dL — ABNORMAL HIGH (ref 30.0–36.0)
MCV: 84.4 fL (ref 78.0–100.0)
Platelets: 227 10*3/uL (ref 150–400)

## 2012-01-12 LAB — URINALYSIS, ROUTINE W REFLEX MICROSCOPIC
Hgb urine dipstick: NEGATIVE
Ketones, ur: 15 mg/dL — AB
Protein, ur: 30 mg/dL — AB
Urobilinogen, UA: 1 mg/dL (ref 0.0–1.0)

## 2012-01-12 LAB — URINE MICROSCOPIC-ADD ON

## 2012-01-12 LAB — BASIC METABOLIC PANEL
Calcium: 10 mg/dL (ref 8.4–10.5)
Creatinine, Ser: 1.24 mg/dL (ref 0.50–1.35)
GFR calc non Af Amer: 57 mL/min — ABNORMAL LOW (ref >90)
Glucose, Bld: 167 mg/dL — ABNORMAL HIGH (ref 70–99)
Sodium: 143 mEq/L (ref 135–145)

## 2012-01-12 MED ORDER — POTASSIUM CHLORIDE 10 MEQ/100ML IV SOLN
10.0000 meq | Freq: Once | INTRAVENOUS | Status: AC
  Administered 2012-01-12: 10 meq via INTRAVENOUS
  Filled 2012-01-12: qty 100

## 2012-01-12 MED ORDER — POTASSIUM CHLORIDE CRYS ER 20 MEQ PO TBCR: 20.0000 meq | EXTENDED_RELEASE_TABLET | Freq: Once | ORAL | Status: DC

## 2012-01-12 MED ORDER — SODIUM CHLORIDE 0.9 % IV BOLUS (SEPSIS)
1000.0000 mL | Freq: Once | INTRAVENOUS | Status: AC
  Administered 2012-01-12: 1000 mL via INTRAVENOUS

## 2012-01-12 MED ORDER — SODIUM CHLORIDE 0.9 % IV BOLUS (SEPSIS)
1000.0000 mL | Freq: Once | INTRAVENOUS | Status: AC
Start: 2012-01-12 — End: 2012-01-13
  Administered 2012-01-13: 1000 mL via INTRAVENOUS

## 2012-01-12 NOTE — Telephone Encounter (Signed)
Called pts nurse again after speaking with Dr Russella Dar.  Per pts records, on his last colonoscopy he was not adequately prepped and was eventually done at the hospital.  Given this information and the fact that the pt does not ambulate, Dr Russella Dar feels that the pt should be done at the Hospital.  Dr Russella Dar also recommends that pt do a more extensive prep.  Gave the nursing home all of this information.  They will check with the pts family and call back to reschedule.

## 2012-01-12 NOTE — ED Notes (Signed)
Pt from [facility].  Was scheduled to have his colonoscopy today but was taken to his appointment late so was unable to have it done today.  Pt drank all liquid required to have the colonoscopy, has been NPO since that time.  Pt A/O x 4, denies pain or discomfort.  Abdomen extremely distended, nontender on palpation.  HTN-166/106.  HR 90.

## 2012-01-12 NOTE — ED Provider Notes (Signed)
History     CSN: [id number]  Arrival date & time 01/12/12  1951   First MD Initiated Contact with Patient 01/12/12 2004      Chief Complaint  Patient presents with  . Nausea    (Consider location/radiation/quality/duration/timing/severity/associated sxs/prior treatment) HPI  Patient presents to the emergency department from his nursing home with complaints of nausea. The patient was scheduled to have a colonoscopy today and since yesterday has been drinking the GoLYTELY solution without any bowel movement. The patient did not have his colonoscopy today due to being late to his appointment the patient has been n.p.o. since yesterday and has not eaten anything. The patient states that he has not been able to keep his GoLYTELY down and has been vomiting it back up. The patient at this time denies abdominal pain or discomfort however his abdomen is grossly distended at this time. The patient's vital signs are stable he appears nontoxic however he does look dehydrated.  Past Medical History  Diagnosis Date  . Hypertension   . Hypercholesterolemia   . CVA (cerebral infarction)   . Bradycardia   . Renal disorder   . Alcohol use     History reviewed. No pertinent past surgical history.  Family History  Problem Relation Age of Onset  . Colon cancer Neg Hx     History  Substance Use Topics  . Smoking status: Former Smoker    Quit date: 12/28/2010  . Smokeless tobacco: Never Used  . Alcohol Use: Not on file      Review of Systems  All other systems reviewed and are negative.    Allergies  Review of patient's allergies indicates no known allergies.  Home Medications   Current Outpatient Rx  Name Route Sig Dispense Refill  . AMLODIPINE BESYLATE 5 MG PO TABS Oral Take 5 mg by mouth daily.    . ASPIRIN 325 MG PO TABS Oral Take 325 mg by mouth daily.    Marland Kitchen FOLIC ACID 1 MG PO TABS Oral Take 1 mg by mouth daily.    . DULOXETINE HCL 60 MG PO CPEP Oral Take 60 mg by mouth  daily.    . FENOFIBRATE 54 MG PO TABS Oral Take 54 mg by mouth daily.    . CERTAGEN PO Oral Take 1 tablet by mouth daily.     Marland Kitchen OMEPRAZOLE 20 MG PO CPDR Oral Take 20 mg by mouth daily.    Marland Kitchen SIMVASTATIN 10 MG PO TABS Oral Take 10 mg by mouth at bedtime.      BP 159/82  Pulse 82  Temp(Src) 99.3 F (37.4 C) (Rectal)  Resp 17  SpO2 95%  Physical Exam  Nursing note and vitals reviewed. Constitutional: He appears well-developed and well-nourished. No distress.  HENT:  Head: Normocephalic and atraumatic.  Eyes: Pupils are equal, round, and reactive to light.  Neck: Normal range of motion. Neck supple.  Cardiovascular: Normal rate and regular rhythm.   Pulmonary/Chest: Effort normal.  Abdominal: Soft. Bowel sounds are normal. He exhibits distension. He exhibits no mass. There is no tenderness. There is no rebound and no guarding.  Neurological: He is alert.  Skin: Skin is warm and dry.    ED Course  Procedures (including critical care time)  Labs Reviewed  CBC - Abnormal; Notable for the following:    WBC 15.2 (*)    RBC 6.10 (*)    Hemoglobin 18.6 (*)    MCHC 36.1 (*)    All other components within normal limits  BASIC METABOLIC PANEL - Abnormal; Notable for the following:    Potassium 3.1 (*)    Glucose, Bld 167 (*)    BUN 29 (*)    GFR calc non Af Amer 57 (*)    GFR calc Af Amer 66 (*)    All other components within normal limits  URINALYSIS, ROUTINE W REFLEX MICROSCOPIC - Abnormal; Notable for the following:    Color, Urine AMBER (*) BIOCHEMICALS MAY BE AFFECTED BY COLOR   Ketones, ur 15 (*)    Protein, ur 30 (*)    All other components within normal limits  URINE MICROSCOPIC-ADD ON - Abnormal; Notable for the following:    Squamous Epithelial / LPF FEW (*)    Casts GRANULAR CAST (*)    All other components within normal limits   Dg Abd Acute W/chest  01/12/2012  *RADIOLOGY REPORT*  Clinical Data: 71 year old male with abdominal pain and distention.  ACUTE ABDOMEN  SERIES (ABDOMEN 2 VIEW & CHEST 1 VIEW)  Comparison: 06/09/2004 CT  Findings: Elevated left hemidiaphragm is again noted with left basilar atelectasis/scarring. Upper limits normal heart size again identified. No definite focal airspace disease, pleural effusion or pneumothorax noted.  Mildly distended gas and fluid filled loops of small bowel, stomach and colon are noted. Stool in the rectum is identified. There is no evidence of pneumoperitoneum. No suspicious calcifications are present. No acute bony abnormalities are identified.  IMPRESSION: Nonspecific bowel gas pattern with mildly distended gas and fluid filled stomach, small bowel and colon.  This most likely represents a gastroenteritis.  No evidence of pneumoperitoneum.  No evidence of acute cardiopulmonary disease.  Original Report Authenticated By: [name], M.D.     1. Constipation   2. Nausea       MDM  At reassessment his vitals signs are stable. Pt has had many large bowel movements in ED that has decompressed his stomach. His stomach is much softer, he is now resting and comfortable. Pt at initial evaluation was clinically dehydrated but has since had two Liters of fluids and was able to tolerate PO fluids (2 cups) in ED and was monitored afterwards with no adverse events.  Pt states he is feeling much better and is comfortable with going back to his facility,      Dorthula Matas, [place] 01/13/12 [phone]

## 2012-01-12 NOTE — ED Notes (Addendum)
See triage note.  Pt cleaned up after having a small BM in the dept.  Pt is unsure of last BM-does not appear to have had a BM since he drank fluid for colonoscopy.  Pt denies pain, no distress noted.  Abdomen nontender on palpation.  Family at bedside.

## 2012-01-12 NOTE — Telephone Encounter (Signed)
Spoke with patients nurse Pamelia Hoit at [facility].  She states that Mr Silversmith has finished all of prep but is still not "cleaned out".  She says that his stools are clay colored and soft.  Not liquid. He has had vomiting with the prep.  I asked her if pt could do an enema and she says that he does not ambulate, he is moved with a lift.  Told Pamelia Hoit that I would try to reach Dr Russella Dar to discuss and return her call.

## 2012-01-12 NOTE — ED Notes (Signed)
Pt tolerated water without difficulty 

## 2012-01-12 NOTE — ED Provider Notes (Signed)
Medical screening examination/treatment/procedure(s) were conducted as a shared visit with non-physician practitioner(s) and myself.  I personally evaluated the patient during the encounter  Patient had been receiving colonoscopy prep but without good bowel movements.  Patient is now starting to have bowel movements here because there had been some concern the patient might be a small bowel obstruction.  His acute abdominal series does demonstrate some mild dilation that would correlate with the patient receiving the colon prep but not having significant stool output.  Patient has a distended but soft and benign abdomen that has no rebound or guarding.  Patient also denies any abdominal pain at this time.  He does appear clinically dehydrated on physical examination and on his laboratory studies.  He'll receive IV fluids and will also check for other signs of infection given he has a low-grade fever here.  Chest x-ray shows no signs of pneumonia he will require a urinalysis as well.  We will continue to reassess this patient.  Nat Christen, MD 01/12/12 2138

## 2012-01-13 NOTE — ED Notes (Signed)
PT repositioned. Bedside cleaning performed.

## 2012-01-13 NOTE — Discharge Instructions (Signed)

## 2012-01-13 NOTE — ED Notes (Signed)
Reported called to unit.  PTAR left with pt prior to pt receiving paperwork.  Pt was updated on POC prior to leaving ED

## 2012-01-13 NOTE — ED Provider Notes (Signed)
Medical screening examination/treatment/procedure(s) were conducted as a shared visit with non-physician practitioner(s) and myself.  I personally evaluated the patient during the encounter  See prior note  Markel Mergenthaler A Troyce Febo, MD 01/13/12 1531 

## 2012-01-13 NOTE — ED Notes (Signed)
Report called to [place], LPN at [facility]

## 2012-01-15 ENCOUNTER — Telehealth: Payer: Self-pay | Admitting: *Deleted

## 2012-01-15 NOTE — Telephone Encounter (Signed)
ERROR. NO PHONE CALL MADE TO PATIENT.

## 2012-01-22 ENCOUNTER — Telehealth: Payer: Self-pay | Admitting: Gastroenterology

## 2012-01-22 NOTE — Telephone Encounter (Signed)
Dr Russella Dar the nursing home is wanting to schedule a recall colon at the hospital.  He ended up in the ER after attempting the prep on 01/12/12.  I saw your phone note suggesting the procedure be done at the hospital with a more extensive prep, see ER note from 01/12/12.  Is it ok to schedule direct for the hospital?

## 2012-01-22 NOTE — Telephone Encounter (Signed)
He should have a 2 day prep with colonoscopy at [facility] during my hospital week. I think we had family stay with him at the nursing home to make sure he took the whole prep last time and we should do the same this time.

## 2012-01-23 NOTE — Telephone Encounter (Signed)
Patient is scheduled for colon at [facility] 02/13/12 8:30.  I have spoken with Jeri Modena at [facility].  I have faxed the instructions to [phone]

## 2012-01-24 ENCOUNTER — Inpatient Hospital Stay (HOSPITAL_COMMUNITY)
Admission: EM | Admit: 2012-01-24 | Discharge: 2012-01-31 | DRG: 871 | Disposition: A | Discharge status: Skilled Nursing Facility | Payer: Medicare Other | Attending: Internal Medicine | Admitting: Internal Medicine

## 2012-01-24 ENCOUNTER — Encounter (HOSPITAL_COMMUNITY): Payer: Self-pay

## 2012-01-24 DIAGNOSIS — N182 Chronic kidney disease, stage 2 (mild): Secondary | ICD-10-CM | POA: Diagnosis present

## 2012-01-24 DIAGNOSIS — K922 Gastrointestinal hemorrhage, unspecified: Secondary | ICD-10-CM

## 2012-01-24 DIAGNOSIS — A419 Sepsis, unspecified organism: Principal | ICD-10-CM | POA: Diagnosis present

## 2012-01-24 DIAGNOSIS — I69991 Dysphagia following unspecified cerebrovascular disease: Secondary | ICD-10-CM

## 2012-01-24 DIAGNOSIS — E871 Hypo-osmolality and hyponatremia: Secondary | ICD-10-CM

## 2012-01-24 DIAGNOSIS — K5900 Constipation, unspecified: Secondary | ICD-10-CM | POA: Diagnosis present

## 2012-01-24 DIAGNOSIS — D72829 Elevated white blood cell count, unspecified: Secondary | ICD-10-CM | POA: Diagnosis present

## 2012-01-24 DIAGNOSIS — K921 Melena: Secondary | ICD-10-CM

## 2012-01-24 DIAGNOSIS — K2971 Gastritis, unspecified, with bleeding: Secondary | ICD-10-CM | POA: Diagnosis present

## 2012-01-24 DIAGNOSIS — R1310 Dysphagia, unspecified: Secondary | ICD-10-CM | POA: Diagnosis present

## 2012-01-24 DIAGNOSIS — K2961 Other gastritis with bleeding: Secondary | ICD-10-CM

## 2012-01-24 DIAGNOSIS — K219 Gastro-esophageal reflux disease without esophagitis: Secondary | ICD-10-CM

## 2012-01-24 DIAGNOSIS — I129 Hypertensive chronic kidney disease with stage 1 through stage 4 chronic kidney disease, or unspecified chronic kidney disease: Secondary | ICD-10-CM | POA: Diagnosis present

## 2012-01-24 DIAGNOSIS — I959 Hypotension, unspecified: Secondary | ICD-10-CM | POA: Diagnosis present

## 2012-01-24 DIAGNOSIS — D62 Acute posthemorrhagic anemia: Secondary | ICD-10-CM | POA: Diagnosis present

## 2012-01-24 DIAGNOSIS — I69959 Hemiplegia and hemiparesis following unspecified cerebrovascular disease affecting unspecified side: Secondary | ICD-10-CM

## 2012-01-24 DIAGNOSIS — J189 Pneumonia, unspecified organism: Secondary | ICD-10-CM | POA: Diagnosis present

## 2012-01-24 DIAGNOSIS — I69920 Aphasia following unspecified cerebrovascular disease: Secondary | ICD-10-CM

## 2012-01-24 HISTORY — DX: Syncope and collapse: R55

## 2012-01-24 HISTORY — DX: Gastrointestinal hemorrhage, unspecified: K92.2

## 2012-01-24 MED ORDER — SODIUM CHLORIDE 0.9 % IV BOLUS (SEPSIS)
1000.0000 mL | Freq: Once | INTRAVENOUS | Status: AC
Start: 2012-01-24 — End: 2012-01-25
  Administered 2012-01-25: 1000 mL via INTRAVENOUS

## 2012-01-24 NOTE — ED Notes (Signed)
ZOX:[id number]<AV> Expected date:01/24/12<BR> Expected time:<BR> Means of arrival:<BR> Comments:<BR> EMS GC - abd pain

## 2012-01-24 NOTE — ED Notes (Signed)
Pt complains of abd pain and being nauseated, pt very diaphoretic, and blood pressure is low

## 2012-01-25 ENCOUNTER — Other Ambulatory Visit: Payer: Self-pay

## 2012-01-25 ENCOUNTER — Emergency Department (HOSPITAL_COMMUNITY): Payer: Medicare Other

## 2012-01-25 ENCOUNTER — Encounter (HOSPITAL_COMMUNITY): Payer: Self-pay | Admitting: Internal Medicine

## 2012-01-25 DIAGNOSIS — D72829 Elevated white blood cell count, unspecified: Secondary | ICD-10-CM | POA: Diagnosis present

## 2012-01-25 DIAGNOSIS — I959 Hypotension, unspecified: Secondary | ICD-10-CM | POA: Diagnosis present

## 2012-01-25 DIAGNOSIS — A419 Sepsis, unspecified organism: Secondary | ICD-10-CM | POA: Clinically undetermined

## 2012-01-25 DIAGNOSIS — N182 Chronic kidney disease, stage 2 (mild): Secondary | ICD-10-CM | POA: Diagnosis present

## 2012-01-25 DIAGNOSIS — E871 Hypo-osmolality and hyponatremia: Secondary | ICD-10-CM | POA: Diagnosis present

## 2012-01-25 DIAGNOSIS — J189 Pneumonia, unspecified organism: Secondary | ICD-10-CM | POA: Diagnosis present

## 2012-01-25 LAB — COMPREHENSIVE METABOLIC PANEL
ALT: 16 U/L (ref 0–53)
Alkaline Phosphatase: 47 U/L (ref 39–117)
BUN: 27 mg/dL — ABNORMAL HIGH (ref 6–23)
CO2: 22 mEq/L (ref 19–32)
Chloride: 100 mEq/L (ref 96–112)
GFR calc Af Amer: 63 mL/min — ABNORMAL LOW (ref >90)
Glucose, Bld: 142 mg/dL — ABNORMAL HIGH (ref 70–99)
Potassium: 4.9 mEq/L (ref 3.5–5.1)
Sodium: 133 mEq/L — ABNORMAL LOW (ref 135–145)
Total Bilirubin: 0.3 mg/dL (ref 0.3–1.2)

## 2012-01-25 LAB — URINALYSIS, ROUTINE W REFLEX MICROSCOPIC
Bilirubin Urine: NEGATIVE
Ketones, ur: NEGATIVE mg/dL
Leukocytes, UA: NEGATIVE
Nitrite: NEGATIVE
Protein, ur: NEGATIVE mg/dL
pH: 6 (ref 5.0–8.0)

## 2012-01-25 LAB — GLUCOSE, CAPILLARY
Glucose-Capillary: 72 mg/dL (ref 70–99)
Glucose-Capillary: 86 mg/dL (ref 70–99)

## 2012-01-25 LAB — CARDIAC PANEL(CRET KIN+CKTOT+MB+TROPI)
CK, MB: 2.5 ng/mL (ref 0.3–4.0)
Relative Index: INVALID (ref 0.0–2.5)
Total CK: 53 U/L (ref 7–232)
Troponin I: <0.3 ng/mL (ref <0.30)

## 2012-01-25 LAB — POCT I-STAT TROPONIN I: Troponin i, poc: 0.01 ng/mL (ref 0.00–0.08)

## 2012-01-25 LAB — CBC
Hemoglobin: 14 g/dL (ref 13.0–17.0)
MCH: 29.4 pg (ref 26.0–34.0)
Platelets: 363 10*3/uL (ref 150–400)
RBC: 4.76 MIL/uL (ref 4.22–5.81)
WBC: 13.4 10*3/uL — ABNORMAL HIGH (ref 4.0–10.5)

## 2012-01-25 LAB — DIFFERENTIAL
Lymphocytes Relative: 22 % (ref 12–46)
Lymphs Abs: 2.9 10*3/uL (ref 0.7–4.0)
Monocytes Relative: 7 % (ref 3–12)
Neutro Abs: 9.4 10*3/uL — ABNORMAL HIGH (ref 1.7–7.7)
Neutrophils Relative %: 71 % (ref 43–77)

## 2012-01-25 MED ORDER — SENNA 8.6 MG PO TABS
1.0000 | ORAL_TABLET | Freq: Two times a day (BID) | ORAL | Status: DC
  Administered 2012-01-26 – 2012-01-31 (×9): 8.6 mg via ORAL
  Filled 2012-01-25 (×10): qty 1

## 2012-01-25 MED ORDER — SIMVASTATIN 10 MG PO TABS
10.0000 mg | ORAL_TABLET | Freq: Every day | ORAL | Status: DC
  Administered 2012-01-26 – 2012-01-31 (×5): 10 mg via ORAL
  Filled 2012-01-25 (×7): qty 1

## 2012-01-25 MED ORDER — VANCOMYCIN HCL 1000 MG IV SOLR
2000.0000 mg | Freq: Once | INTRAVENOUS | Status: AC
  Administered 2012-01-25: 2000 mg via INTRAVENOUS
  Filled 2012-01-25: qty 2000

## 2012-01-25 MED ORDER — PIPERACILLIN SOD-TAZOBACTAM SO 2.25 (2-0.25) G IV SOLR
3.3750 g | Freq: Once | INTRAVENOUS | Status: AC
  Administered 2012-01-25: 3.375 g via INTRAVENOUS
  Filled 2012-01-25: qty 3.38

## 2012-01-25 MED ORDER — DOCUSATE SODIUM 100 MG PO CAPS
100.0000 mg | ORAL_CAPSULE | Freq: Two times a day (BID) | ORAL | Status: DC
  Administered 2012-01-26 – 2012-01-31 (×9): 100 mg via ORAL
  Filled 2012-01-25 (×14): qty 1

## 2012-01-25 MED ORDER — FENOFIBRATE 54 MG PO TABS
54.0000 mg | ORAL_TABLET | Freq: Every day | ORAL | Status: DC
  Administered 2012-01-27 – 2012-01-31 (×5): 54 mg via ORAL
  Filled 2012-01-25 (×7): qty 1

## 2012-01-25 MED ORDER — VANCOMYCIN HCL IN DEXTROSE 1-5 GM/200ML-% IV SOLN
1000.0000 mg | Freq: Once | INTRAVENOUS | Status: AC
  Administered 2012-01-25: 1000 mg via INTRAVENOUS
  Filled 2012-01-25: qty 200

## 2012-01-25 MED ORDER — SODIUM CHLORIDE 0.9 % IV SOLN
INTRAVENOUS | Status: DC
  Administered 2012-01-25: 03:00:00 via INTRAVENOUS

## 2012-01-25 MED ORDER — ONDANSETRON HCL 4 MG PO TABS: 4.0000 mg | ORAL_TABLET | Freq: Four times a day (QID) | ORAL | Status: DC | PRN

## 2012-01-25 MED ORDER — HEPARIN SODIUM (PORCINE) 5000 UNIT/ML IJ SOLN
5000.0000 [IU] | Freq: Three times a day (TID) | INTRAMUSCULAR | Status: DC
  Administered 2012-01-25 – 2012-01-27 (×6): 5000 [IU] via SUBCUTANEOUS
  Filled 2012-01-25 (×10): qty 1

## 2012-01-25 MED ORDER — SODIUM CHLORIDE 0.9 % IJ SOLN
3.0000 mL | Freq: Two times a day (BID) | INTRAMUSCULAR | Status: DC
  Administered 2012-01-26 – 2012-01-31 (×4): 3 mL via INTRAVENOUS

## 2012-01-25 MED ORDER — ONDANSETRON HCL 4 MG/2ML IJ SOLN: 4.0000 mg | Freq: Four times a day (QID) | INTRAMUSCULAR | Status: DC | PRN

## 2012-01-25 MED ORDER — ACETAMINOPHEN 325 MG PO TABS
650.0000 mg | ORAL_TABLET | Freq: Four times a day (QID) | ORAL | Status: DC | PRN
  Administered 2012-01-26 – 2012-01-28 (×2): 650 mg via ORAL
  Filled 2012-01-25 (×2): qty 2

## 2012-01-25 MED ORDER — VANCOMYCIN HCL 1000 MG IV SOLR
1500.0000 mg | INTRAVENOUS | Status: DC
  Filled 2012-01-25: qty 1500

## 2012-01-25 MED ORDER — PIPERACILLIN-TAZOBACTAM 3.375 G IVPB
3.3750 g | Freq: Three times a day (TID) | INTRAVENOUS | Status: DC
  Administered 2012-01-25 – 2012-01-31 (×18): 3.375 g via INTRAVENOUS
  Filled 2012-01-25 (×21): qty 50

## 2012-01-25 MED ORDER — SODIUM CHLORIDE 0.9 % IV SOLN: INTRAVENOUS | Status: DC

## 2012-01-25 MED ORDER — ACETAMINOPHEN 650 MG RE SUPP: 650.0000 mg | Freq: Four times a day (QID) | RECTAL | Status: DC | PRN

## 2012-01-25 MED ORDER — DEXTROSE-NACL 5-0.9 % IV SOLN
INTRAVENOUS | Status: DC
  Administered 2012-01-25 – 2012-01-29 (×5): via INTRAVENOUS

## 2012-01-25 MED ORDER — DULOXETINE HCL 60 MG PO CPEP
60.0000 mg | ORAL_CAPSULE | Freq: Every day | ORAL | Status: DC
  Administered 2012-01-25 – 2012-01-31 (×6): 60 mg via ORAL
  Filled 2012-01-25 (×7): qty 1

## 2012-01-25 MED ORDER — ASPIRIN 325 MG PO TABS
325.0000 mg | ORAL_TABLET | Freq: Every day | ORAL | Status: DC
  Administered 2012-01-25 – 2012-01-27 (×2): 325 mg via ORAL
  Filled 2012-01-25 (×3): qty 1

## 2012-01-25 NOTE — ED Provider Notes (Signed)
History     CSN: [id number]  Arrival date & time 01/24/12  2311   First MD Initiated Contact with Patient 01/24/12 2342      Chief Complaint  Patient presents with  . hypotensive     (Consider location/radiation/quality/duration/timing/severity/associated sxs/prior treatment) HPI Comments: Patient with a history of CVA, hypertension, bradycardia, and renal insufficiency presents the emergency department via EMS with a chief complaint of hypotension.  Patient was sent from skilled nursing facility [facility] Onset of hypotension was today with nausea as the only associated symptom.  Patient denies any abdominal pain, chest pain, SOB, cough, fevers, night sweats, chills, recent sickness, lightheadedness, syncope, HA, or change in vision.  Last bowel movement was yesterday.  Patient has no complaints at this time.  Patient's sister states that she saw the patient earlier today and he did not appear diaphoretic, pale or cold as he does now. Pt does not ambulate at baseline s/p right sided CVA   The history is provided by the patient, the nursing home, the EMS personnel, a relative and medical records.    Past Medical History  Diagnosis Date  . Hypertension   . Hypercholesterolemia   . CVA (cerebral infarction)   . Bradycardia   . Renal disorder   . Alcohol use     History reviewed. No pertinent past surgical history.  Family History  Problem Relation Age of Onset  . Colon cancer Neg Hx     History  Substance Use Topics  . Smoking status: Former Smoker    Quit date: 12/28/2010  . Smokeless tobacco: Never Used  . Alcohol Use: Yes      Review of Systems  Allergies  Review of patient's allergies indicates no known allergies.  Home Medications   Current Outpatient Rx  Name Route Sig Dispense Refill  . AMLODIPINE BESYLATE 5 MG PO TABS Oral Take 5 mg by mouth daily.    . ASPIRIN 325 MG PO TABS Oral Take 325 mg by mouth daily.    . DULOXETINE HCL 60 MG PO CPEP Oral  Take 60 mg by mouth daily.    . FENOFIBRATE 54 MG PO TABS Oral Take 54 mg by mouth daily.    Marland Kitchen FOLIC ACID 1 MG PO TABS Oral Take 1 mg by mouth daily.    . CERTAGEN PO Oral Take 1 tablet by mouth daily.     Marland Kitchen OMEPRAZOLE 20 MG PO CPDR Oral Take 20 mg by mouth daily.    Marland Kitchen SIMVASTATIN 10 MG PO TABS Oral Take 10 mg by mouth at bedtime.      BP 81/68  Pulse 86  Temp(Src) 99.6 F (37.6 C) (Rectal)  Resp 16  SpO2 99%  Physical Exam  Nursing note and vitals reviewed. Constitutional: He has a sickly appearance. He appears ill. No distress.       Hypotensive, Pale and diaphoretic  HENT:  Head: Normocephalic and atraumatic.  Eyes:       Right eye reactive to light.   Neck: Normal range of motion. Neck supple. Normal carotid pulses and no JVD present. Carotid bruit is not present. No rigidity. Normal range of motion present.  Cardiovascular: Normal rate, regular rhythm, S1 normal, S2 normal, normal heart sounds, intact distal pulses and normal pulses.  Exam reveals no gallop and no friction rub.   No murmur heard.      No pitting edema bilaterally, RRR, no aberrant sounds on auscultations, distal pulses difficult to palpate, no carotid bruit or JVD.  Pulmonary/Chest: Effort normal. No accessory muscle usage or stridor. Not tachypneic. No respiratory distress. He has rales in the right middle field and the left middle field. He exhibits no tenderness and no bony tenderness.       Pt does not appear to be in respiratory distress.   Abdominal: Bowel sounds are normal.       Soft non tender. Non pulsatile aorta.   Neurological: He is alert.  Skin: Skin is intact. No rash noted. He is diaphoretic. No cyanosis. There is pallor. Nails show no clubbing.       Cold and clamy     ED Course  Procedures (including critical care time)  Labs Reviewed  LACTIC ACID, PLASMA - Abnormal; Notable for the following:    Lactic Acid, Venous 2.9 (*)    All other components within normal limits  CBC -  Abnormal; Notable for the following:    WBC 13.4 (*)    All other components within normal limits  DIFFERENTIAL - Abnormal; Notable for the following:    Neutro Abs 9.4 (*)    All other components within normal limits  COMPREHENSIVE METABOLIC PANEL - Abnormal; Notable for the following:    Sodium 133 (*)    Glucose, Bld 142 (*)    BUN 27 (*)    Albumin 2.8 (*)    GFR calc non Af Amer 54 (*)    GFR calc Af Amer 63 (*)    All other components within normal limits  POCT I-STAT TROPONIN I  URINALYSIS, ROUTINE W REFLEX MICROSCOPIC  URINE CULTURE  CULTURE, BLOOD (ROUTINE X 2)  CULTURE, BLOOD (ROUTINE X 2)   Dg Chest Port 1 View  01/25/2012  *RADIOLOGY REPORT*  Clinical Data: Hypertension  PORTABLE CHEST - 1 VIEW  Comparison: 06/15/2009  Findings: There is a tortuous and unfolded thoracic aorta.  Lung volumes are low.  There is an opacity within the right upper lobe and left base suspicious for infiltrate.  IMPRESSION:  1.  Low lung volumes. 2.  Bilateral infiltrates.  Original Report Authenticated By: Rosealee Albee, M.D.     No diagnosis found.  No IV access. EJ attempted x 3 with Dr. Juleen China . Pts BP 87/60 IV team re-paged.  IV team maintained access. Vanc and Zosyn ordered   MDM  Sepsis, PNA, Hypotension   Pt given Vanc, zosyn and fluids. The patient appears reasonably stabilized for admission considering the current resources, flow, and capabilities available in the ED at this time, and I doubt any other [facility] requiring further screening and/or treatment in the ED prior to admission.   CRITICAL CARE Performed by: Jaci Carrel   Total critical care time: 30  Critical care time was exclusive of separately billable procedures and treating other patients.  Critical care was necessary to treat or prevent imminent or life-threatening deterioration.  Critical care was time spent personally by me on the following activities: development of treatment plan with patient and/or  surrogate as well as nursing, discussions with consultants, evaluation of patient's response to treatment, examination of patient, obtaining history from patient or surrogate, ordering and performing treatments and interventions, ordering and review of laboratory studies, ordering and review of radiographic studies, pulse oximetry and re-evaluation of patient's condition.  MDM Number of Diagnoses or Management Options   Amount and/or Complexity of Data Reviewed Clinical lab tests: ordered and reviewed Tests in the radiology section of CPT: ordered and reviewed Tests in the medicine section of CPT: ordered and reviewed  Decide to obtain previous medical records or to obtain history from someone other than the patient: yes Obtain history from someone other than the patient: yes Review and summarize past medical records: yes Discuss the patient with other providers: yes  Risk of Complications, Morbidity, and/or Mortality Presenting problems: moderate  Critical Care Total time providing critical care: 30-74 minutes  Patient Progress Patient progress: stable      Jaci Carrel, [place] 01/26/12 [phone]

## 2012-01-25 NOTE — H&P (Signed)
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   Review of pt's recent history shows pt was getting prepped for colonoscopy on 3/8 and was  having some vomiting, so he was sent to the ED with complaints of nausea. Abd plain film  showed mild dilation. He had a few BM's and his stomach was decompressed, felt better. He  was noted to have low grade fever while there, but had negative UA and CXR at that time.  Discharged from the ED. He also appeared to have WBC count 15 at that time.   Pt cannot give reliable history, suspect due to aphasia, although he is alert, attentive,  and can answer simple questions well, so history gathered from ED staff, who spoke with  the sister when she was here earlier, but not now. Apparently the pt's vitals were  gathered at the facility earlier tonight and noted to be hypotensive for which he was  sent to the ED, but no reported symptoms at all. NH documents only document "resp.  distress" and vitals 108/62 and 100/palp, HR 67, temp 96.4, RR 23, but nothing else. On  arrival per ED he was diaphoretic, but according to sister was at his mental baseline,  and the diaphoresis resolved through the ED course.    In the ED pt was hypotense to 81/68, HR 86, temp 99.6, oxygenating well. Labs significant  for new hypoNa 133 down from 143 on 3/8, renal at baseline Cr 1.3, negative Trop POC,  lactate elevated 2.9, WBC up to 13.4 (was 15.4 on 3/8), rest of CBC normal. CXR with  bilateral infiltrates in RUL and L base.   To me, pt awakens and has zero complaints, denies fevers, chills, sweats, cough, chest  pain, SOB, abd pain, nausea, vomiting, diarrhea, dizziness, lightheadedness. He feels at  his baseline, ROS is otherwise  negative   Past Medical History  Diagnosis Date  . Hypertension   . Hypercholesterolemia   . 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 Bradycardia 06/2009    HR 30-40's with decreased consciousness  . Renal disorder   . Alcohol use   . GI bleed     H/o hematochezia     History reviewed. No pertinent past surgical history.  Medications:  HOME MEDS: Per NH list  Prior to Admission medications   Medication Sig Start Date End Date Taking? Authorizing Provider  amLODipine (NORVASC) 5 MG tablet Take 5 mg by mouth daily.   Yes Historical Provider, MD  aspirin 325 MG tablet Take 325 mg by mouth daily.   Yes Historical Provider, MD  DULoxetine (CYMBALTA) 60 MG capsule Take 60 mg by mouth daily.   Yes Historical Provider, MD  fenofibrate 54 MG tablet Take 54 mg by mouth daily.   Yes Historical Provider, MD  folic acid (FOLVITE) 1 MG tablet Take 1 mg by mouth daily.   Yes Historical Provider, MD  Multiple Vitamins-Minerals (CERTAGEN PO) Take 1 tablet by mouth daily.    Yes Historical Provider, MD  omeprazole (PRILOSEC) 20 MG capsule Take 20 mg by mouth daily.   Yes Historical Provider, MD  simvastatin (ZOCOR) 10 MG tablet Take 10 mg by  mouth at bedtime.   Yes Historical Provider, MD    Allergies:  No Known Allergies  Social History:   reports that he quit smoking about 12 months ago. He has never used smokeless tobacco. He reports that he drinks alcohol. His drug history not on file. Lives at [facility]. Contact is Ozzie Hoyle, sister. [phone] or [phone]  Family History: Family History  Problem Relation Age of Onset  . Colon cancer Neg Hx     Physical Exam: Filed Vitals:   01/24/12 2328 01/25/12 0016 01/25/12 0221  BP: 93/60 81/68 100/64  Pulse: 86 86 78  Temp: 99.6 F (37.6 C)    TempSrc: Rectal    Resp: 16  14  SpO2: 100% 99% 98%   Blood pressure  100/64, pulse 78, temperature 99.6 F (37.6 C), temperature source Rectal, resp. rate 14, SpO2 98.00%.  Gen: Tall, debilitated but still moderately robust, not cachectic appearing M in no  distress, awakens to voice and is pleasant, able to answer simple questions, but cannot  give full sentences, likely due to aphasia. No increased WOB or accessory muscle use,  appears calm  HEENT: Left eye has complete corneal opacification, right eye normal appearing, with  reactive pupil, EOMI, sclera clear. Mouth is moist, normal appearing overall Lungs: Poor exam, cannot sit pt up, but grossly clear without wheezes, crackles, but with  some upper airway rubbing sounds Heart: regular, not tachycardic, no m/g heard, normal overall Abd: Prominent belly, but soft, minimally distended, not tender in any quadrant, BS+, no  facial grimacing Extrem: Very cool, almost cold hands and feet, a bit warmer more proximally. No noted  cyanosis or clubbing, no BLE edema noted. Some muscle wasting in extremities noted Neuro: Alert and awoken, conversant with one word answers, but aphasic for full  sentences. CN 2-12 intact, speech is clear without slurring. RUE has full strength and  RLE can lift off bed on his own, wiggles toes, press on gas pedal, however the LUE and  LLE have very dense hemiparesis.    Labs & Imaging Results for orders placed during the hospital encounter of 01/24/12 (from the past 48 hour(s))  CBC     Status: Abnormal   Collection Time   01/24/12 11:58 PM      Component Value Range Comment   WBC 13.4 (*) 4.0 - 10.5 (K/uL)    RBC 4.76  4.22 - 5.81 (MIL/uL)    Hemoglobin 14.0  13.0 - 17.0 (g/dL)    HCT 16.1  09.6 - 04.5 (%)    MCV 86.6  78.0 - 100.0 (fL)    MCH 29.4  26.0 - 34.0 (pg)    MCHC 34.0  30.0 - 36.0 (g/dL)    RDW 40.9  81.1 - 91.4 (%)    Platelets 363  150 - 400 (K/uL)   DIFFERENTIAL     Status: Abnormal   Collection Time   01/24/12 11:58 PM      Component Value Range Comment    Neutrophils Relative 71  43 - 77 (%)    Neutro Abs 9.4 (*) 1.7 - 7.7 (K/uL)    Lymphocytes Relative 22  12 - 46 (%)    Lymphs Abs 2.9  0.7 - 4.0 (K/uL)    Monocytes Relative 7  3 - 12 (%)    Monocytes Absolute 1.0  0.1 - 1.0 (K/uL)    Eosinophils Relative 0  0 - 5 (%)    Eosinophils Absolute 0.0  0.0 - 0.7 (K/uL)    Basophils Relative 0  0 - 1 (%)    Basophils Absolute 0.1  0.0 - 0.1 (K/uL)   COMPREHENSIVE METABOLIC PANEL     Status: Abnormal   Collection Time   01/24/12 11:58 PM      Component Value Range Comment   Sodium 133 (*) 135 - 145 (mEq/L)    Potassium 4.9  3.5 - 5.1 (mEq/L) SLIGHT HEMOLYSIS   Chloride 100  96 - 112 (mEq/L)    CO2 22  19 - 32 (mEq/L)    Glucose, Bld 142 (*) 70 - 99 (mg/dL)    BUN 27 (*) 6 - 23 (mg/dL)    Creatinine, Ser 4.09  0.50 - 1.35 (mg/dL)    Calcium 9.6  8.4 - 10.5 (mg/dL)    Total Protein 6.8  6.0 - 8.3 (g/dL)    Albumin 2.8 (*) 3.5 - 5.2 (g/dL)    AST 31  0 - 37 (U/L) SLIGHT HEMOLYSIS   ALT 16  0 - 53 (U/L)    Alkaline Phosphatase 47  39 - 117 (U/L)    Total Bilirubin 0.3  0.3 - 1.2 (mg/dL)    GFR calc non Af Amer 54 (*) >90 (mL/min)    GFR calc Af Amer 63 (*) >90 (mL/min)   POCT I-STAT TROPONIN I     Status: Normal   Collection Time   01/25/12 12:10 AM      Component Value Range Comment   Troponin i, poc 0.01  0.00 - 0.08 (ng/mL)    Comment 3            LACTIC ACID, PLASMA     Status: Abnormal   Collection Time   01/25/12  1:13 AM      Component Value Range Comment   Lactic Acid, Venous 2.9 (*) 0.5 - 2.2 (mmol/L)    Dg Chest Port 1 View  01/25/2012  *RADIOLOGY REPORT*  Clinical Data: Hypertension  PORTABLE CHEST - 1 VIEW  Comparison: 06/15/2009  Findings: There is a tortuous and unfolded thoracic aorta.  Lung volumes are low.  There is an opacity within the right upper lobe and left base suspicious for infiltrate.  IMPRESSION:  1.  Low lung volumes. 2.  Bilateral infiltrates.  Original Report Authenticated By: Rosealee Albee, M.D.     ECG: NSR 91 bpm, normal axis, normal P and PR. Narrow QRS. No frank ST deviations.  Inverted TW in high lateral leads and in II, aVF. There are no prior for comparison.   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   1. HypoTN: My review of EPIC vitals avaiable shows SBP's 120-190 and DBP's up to 105 over  the past few years, so this hypoTN does seem new and not his baseline.   Unclear the source at present, he completely denies all symptoms and per discussion there  were no complaints or anything on ROS leading up to this (although history is limited).  He does have abnormal CXR compared to earlier this month, and persistently elevated WBC  so PNA is leading Dx (HCAP given he lives in facility), although no reported fevers or  cough to corroborate this.   Given ECG changes of lateral TWI without  prior for comparison and cool extremities,  cardiogenic shock also considered, however pt denies any chest pain and first Trop POC is  negative, making this less likely. Hypovolemic hypoTN also considered, given minimal  hypoNa 133, acutely down from 143 earlier this month, however renal fxn stable.    Currently, pt running SBP 100-120's on the ED monitor for the past 1-2 hours, with only  1L of NS, mentating seemingly at baseline.   - Admit SDU for presumed sepsis, continue IVF's, monitor UOP and Cr closely, Vanc/Zosyn  for now, needs UA and UCx to be collected. F/u blood cultures, sputum Cx if able. Trend  lactate  - Holding amlodipine   2. Abnormal ECG: As above, pt without symptoms or prior for comparison, and with negative  point of care Trop, so doubt this is ACS but  will do rule out MI and trend for now.  - Continue home ASA 325  3. CKD: Cr is stable 1.3 from 1.24 earlier this month, with prior values of 0.9-1.1 a few  years ago. Therefore, this is presumably his new baseline (CKD stage II).   4. Continue home fibrate, statin, cymbalta, holding other non essential meds   5. H/o stroke: Keep NPO for now, will order swallow screen before starting diet   SubQ heparin SDU, [facility] team 6 Presumed full code, as noted on NH documents   Other plans as per orders.  Critical care time: 60 minutes.   Isaiah [name] 01/25/2012, 3:30 AM

## 2012-01-25 NOTE — Progress Notes (Signed)
   Patient seen and examined, and data base reviewed.  Seen earlier by my colleague Dr. Carlota Raspberry.  Presumed sepsis secondary to pneumonia. Currently his vitals are stable.  Continue antibiotics.  Clint Lipps Pager: [phone] 01/25/2012, 3:25 PM

## 2012-01-25 NOTE — Progress Notes (Signed)
ANTIBIOTIC CONSULT NOTE - INITIAL  Pharmacy Consult for Vanc/Zosyn Indication: HCAP  No Known Allergies  Patient Measurements: Wt: 105kg (12/29/11) Ht: 72 in IBW: 77.6kg Adjusted body weight: 88.5kg  Vital Signs: Temp: 98.5 F (36.9 C) (03/21 1709) Temp src: Oral (03/21 1709) BP: 126/55 mmHg (03/21 1744) Pulse Rate: 92  (03/21 1218) Intake/Output from previous day:   Intake/Output from this shift: Total I/O In: -  Out: 600 [Urine:600]  Labs:  [facility] 01/24/12 2358  WBC 13.4*  HGB 14.0  PLT 363  LABCREA --  CREATININE 1.30   CrCl = 53 ml/min (normalized)   Microbiology: Pending  Medical History: Past Medical History  Diagnosis Date  . Hypertension   . Hypercholesterolemia   . 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 Bradycardia 06/2009    HR 30-40's with decreased consciousness  . Renal disorder   . Alcohol use   . GI bleed     H/o hematochezia   . Syncope   . History of colonic polyps     Medications:  Scheduled:    . aspirin  325 mg Oral Daily  . docusate sodium  100 mg Oral BID  . DULoxetine  60 mg Oral Daily  . fenofibrate  54 mg Oral Daily  . heparin  5,000 Units Subcutaneous Q8H  . piperacillin-tazobactam (ZOSYN)  IV  3.375 g Intravenous Once  . senna  1 tablet Oral BID  . simvastatin  10 mg Oral QHS  . sodium chloride  1,000 mL Intravenous Once  . sodium chloride  3 mL Intravenous Q12H  . vancomycin  1,000 mg Intravenous Once   Infusions:    . dextrose 5 % and 0.9% NaCl 125 mL/hr at 01/25/12 1316  . DISCONTD: sodium chloride    . DISCONTD: sodium chloride 125 mL/hr at 01/25/12 0245   Assessment:  70 YOM nursing home resident presents to ER with hypotension, leukocytosis, with abnormal CXR.  Beginning broad spectrum antibiotics for presumed sepsis secondary to PNA.   Goal of Therapy:  Vancomycin trough level 15-20  mcg/ml  Plan:   Received Vancomycin 1g + Zosyn 3.375g ~2am in ER  Vancomycin 2g IV x 1 now for weight >100kg, then 1500mg  IV q24h  Check trough at steady state Zosyn 3.375gm IV q8h (4hr extended infusions) Follow renal function & cultures   Loralee Pacas, PharmD, BCPS Pager: [phone] 01/25/2012,6:28 PM

## 2012-01-25 NOTE — ED Notes (Signed)
Perif IV attempted X 3 without success. EJ attempted X 3 by physician without success, including use of ultrasound.

## 2012-01-25 NOTE — ED Notes (Signed)
Attempted to call report. Receiving nurse with another patient and is to call for report

## 2012-01-26 LAB — BASIC METABOLIC PANEL
BUN: 30 mg/dL — ABNORMAL HIGH (ref 6–23)
Calcium: 8.7 mg/dL (ref 8.4–10.5)
GFR calc Af Amer: 75 mL/min — ABNORMAL LOW (ref >90)
GFR calc non Af Amer: 65 mL/min — ABNORMAL LOW (ref >90)
Potassium: 3.8 mEq/L (ref 3.5–5.1)

## 2012-01-26 LAB — URINE CULTURE
Colony Count: NO GROWTH
Culture  Setup Time: 201303211034

## 2012-01-26 LAB — GLUCOSE, CAPILLARY

## 2012-01-26 LAB — CARDIAC PANEL(CRET KIN+CKTOT+MB+TROPI)
CK, MB: 3.3 ng/mL (ref 0.3–4.0)
Relative Index: INVALID (ref 0.0–2.5)
Total CK: 50 U/L (ref 7–232)

## 2012-01-26 LAB — CBC
HCT: 29.8 % — ABNORMAL LOW (ref 39.0–52.0)
MCH: 28.7 pg (ref 26.0–34.0)
MCHC: 32.9 g/dL (ref 30.0–36.0)
RDW: 14 % (ref 11.5–15.5)

## 2012-01-26 MED ORDER — VANCOMYCIN HCL 1000 MG IV SOLR
1250.0000 mg | Freq: Two times a day (BID) | INTRAVENOUS | Status: DC
  Administered 2012-01-26 – 2012-01-31 (×10): 1250 mg via INTRAVENOUS
  Filled 2012-01-26 (×12): qty 1250

## 2012-01-26 NOTE — Evaluation (Signed)
Clinical/Bedside Swallow Evaluation Patient Details  Name: Isaiah [name] MRN: [id number] DOB: 1941/06/04 Today's Date: 01/26/2012 1330-1415  Past Medical History:  Past Medical History  Diagnosis Date  . Hypertension   . Hypercholesterolemia   . 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 Bradycardia 06/2009    HR 30-40's with decreased consciousness  . Renal disorder   . Alcohol use   . GI bleed     H/o hematochezia   . Syncope   . History of colonic polyps    Past Surgical History:  Past Surgical History  Procedure Date  . Orif left tibia fracture   . Colonoscopy with polypectomy 2001   HPI:  Pt is a 71 yo male resident of [address] of 3 years after CVA admitted with respiratory issues, CXR 01/26/12 showed right upper lobe opacity, left base, ? infiltrate.  Pt DG abdomen 01/12/12 + for gastroenteritis, nonspecific small bowel gas pattern with mild distention gas and fluid filled stomach, small bowel and colon.  Pt reports problems swallowing food and liquids with food stasis in pharynx and coughing during meals.  Pt denies vomiting, acknowledges sore throat  more recently.  Pt also with h/o constipation.  Pt takes Prilosec for reflux.  Pt had previous MBS studies in 2010 with last recommendation for Dys2/Thin, pt had previously been on Dys1/honey diet.  Pt reports poor intake, consuming liquids more than eating.  Pt takes his medications crushed with applesauce/pudding follow with water.  Pt reports being  nonambulatory since going to SNF.    Assessment/Recommendations/Treatment Plan Suspected Esophageal Findings Suspected Esophageal Findings: Other (comment) (h/o GERD on a PPI)  SLP Assessment Clinical Impression Statement: Oral care needed, pt states his sister will come brush his teeth, but pt able to brush his teeth with set up assist today with SlP.  Pt appears wtih  mild dysphagia likely c/w findings of MBS Aug 2010.  NO s/s of aspiration noted with 3 ounces water, once ounce nectar juice, 2 ounces icecream, 3 crackers, and 2 bites applesauce.  Clear voice throughout.  Pt is impulsive and takes large sips, he acknowledges that he "gulps:Marland Kitchen  Recommend initiate soft/ground meats with thin (prefer water with meals) and asp/reflux precautions.  Pt did acknowledge problems swallowing in the last month and acknowledged better tolerance today due to items eaten.  SLP educated pt to multiple risk factors for asp pna, including reliance on others for feeding, condition of dentition, moblility status, etc.  Pt verbalized understanding to information provided.   Other Related Risk Factors: History of pneumonia;History of dysphagia;History of GERD;Cognitive impairment;Previous CVA;Decreased management of secretions  Swallow Evaluation Recommendations Solid Consistency: Dysphagia 3 (Mechanical soft) (ground meats) Liquid Consistency: Thin (WATER with meals) Liquid Administration via: Cup (pt does not use straws per his statement d/t choking) Medication Administration: Crushed with puree (follow with water) Supervision: Patient able to self feed (assist for left HP) Compensations: Slow rate;Small sips/bites (check for oral residuals) Postural Changes and/or Swallow Maneuvers: Out of bed for meals;Seated upright 90 degrees Oral Care Recommendations: Oral care BID;Other (Comment) (rec consider dental visit for decay)  Treatment Plan Speech Therapy Frequency: min 2x/week Treatment Duration: 2 weeks Interventions: Aspiration precaution training;Diet toleration management by SLP;Patient/family education (slp educated pt to multiple risk factors for asp pna )  Prognosis Prognosis for Safe Diet Advancement: Guarded     Swallowing Goals  SLP Swallowing Goals  Patient will utilize recommended strategies during swallow to increase swallowing safety with: Maximum  assistance  General  Date of Onset: 01/26/12 HPI: Pt is a 71 yo male resident of [address] of 3 years after CVA admitted with respiratory issues, CXR 01/26/12 showed right upper lobe opacity, left base, ? infiltrate.  Pt DG abdomen 01/12/12 + for gastroenteritis, nonspecific small bowel gas pattern with mild distention gas and fluid filled stomach, small bowel and colon.  Pt reports problems swallowing food and liquids with food stasis in pharynx and coughing during meals.  Pt denies vomiting, acknowledges sore throat  more recently.  Pt also with h/o constipation.  Pt takes Prilosec for reflux.  Pt had previous MBS studies in 2010 with last recommendation for Dys2/Thin, pt had previously been on Dys1/honey diet.  Pt reports poor intake, consuming liquids more than eating.  Pt takes his medications crushed with applesauce/pudding follow with water.  Pt reports being  nonambulatory since going to SNF.  Type of Study: Bedside swallow evaluation Diet Prior to this Study: NPO Respiratory Status: Supplemental O2 delivered via (comment) (pt not on oxygen at SNF per his statement) History of Intubation: No Behavior/Cognition: Alert;Cooperative (flat affect) Oral Cavity - Dentition: Poor condition;Missing dentition (missing some dentition) Vision: Functional for self-feeding Patient Positioning: Upright in bed Baseline Vocal Quality: Clear Volitional Cough: Weak Volitional Swallow: Able to elicit  Oral Motor/Sensory Function  Overall Oral Motor/Sensory Function: Impaired at baseline (h/o right cva) Labial ROM: Reduced left Labial Strength: Reduced Labial Sensation: Reduced Lingual ROM: Reduced left Lingual Strength: Reduced Facial ROM: Reduced left Facial Symmetry: Left droop Velum:  (unable to view) Mandible: Impaired  Consistency Results  Ice Chips Ice chips: Impaired Pharyngeal Phase Impairments: Delayed Swallow  Thin Liquid Thin Liquid: Impaired (4 ounces water) Pharyngeal  Phase  Impairments: Delayed Swallow Other Comments: pt stated he does not use straws as they cause him to cough, no clinical s/s of aspiration, pt doe snot take small sips  Nectar Thick Liquid Nectar Thick Liquid: Impaired (2 ounces) Pharyngeal Phase Impairments: Delayed Swallow Other Comments: pt c/o thickener feeling gritty and painful for teeth, pt does not take small sips  Honey Thick Liquid Honey Thick Liquid: Not tested  Puree Puree: Impaired Pharyngeal Phase Impairments: Delayed Swallow Other Comments: self fed with slp holding icecream 2 ounces, minimal delay without s/s of asp  Solid Solid: Impaired Oral Phase Impairments: Reduced lingual movement/coordination;Impaired anterior to posterior transit Pharyngeal Phase Impairments: Delayed Swallow Other Comments: self fed    Donavan Burnet, MS [facility] SLP [phone]

## 2012-01-26 NOTE — Progress Notes (Signed)
INITIAL ADULT NUTRITION ASSESSMENT Date: 01/26/2012   Time: 1:12 PM Reason for Assessment: low braden  ASSESSMENT: Male 71 y.o.  Dx: Sepsis  Hx: Past Medical History  Diagnosis Date  . Hypertension   . Hypercholesterolemia   . 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 Bradycardia 06/2009    HR 30-40's with decreased consciousness  . Renal disorder   . Alcohol use   . GI bleed     H/o hematochezia   . Syncope   . History of colonic polyps    Past Surgical History  Procedure Date  . Orif left tibia fracture   . Colonoscopy with polypectomy 2001    Related Meds:  Scheduled Meds:   . aspirin  325 mg Oral Daily  . docusate sodium  100 mg Oral BID  . DULoxetine  60 mg Oral Daily  . fenofibrate  54 mg Oral Daily  . heparin  5,000 Units Subcutaneous Q8H  . piperacillin-tazobactam (ZOSYN)  IV  3.375 g Intravenous Q8H  . senna  1 tablet Oral BID  . simvastatin  10 mg Oral QHS  . sodium chloride  3 mL Intravenous Q12H  . vancomycin  1,500 mg Intravenous Q24H  . vancomycin  2,000 mg Intravenous Once   Continuous Infusions:   . dextrose 5 % and 0.9% NaCl 125 mL/hr at 01/26/12 0714   PRN Meds:.acetaminophen, acetaminophen, ondansetron (ZOFRAN) IV, ondansetron   Ht: 6' (182.9 cm)  Wt: 208 lb 5.4 oz (94.5 kg)  Ideal Wt: 80.2 kg % Ideal Wt: 117%  Usual Wt: 104 kg in 12/2011, no family at bedside to confirm usual wt % Usual Wt: 90%  Body mass index is 28.26 kg/(m^2).  Food/Nutrition Related Hx:  Diet at NH, Regular with chopped meats, thin liquids.  Questionable wt hx.  Labs:  Scheduled Meds:   . aspirin  325 mg Oral Daily  . docusate sodium  100 mg Oral BID  . DULoxetine  60 mg Oral Daily  . fenofibrate  54 mg Oral Daily  . heparin  5,000 Units Subcutaneous Q8H  . piperacillin-tazobactam (ZOSYN)  IV  3.375 g Intravenous Q8H  . senna  1 tablet Oral BID    . simvastatin  10 mg Oral QHS  . sodium chloride  3 mL Intravenous Q12H  . vancomycin  1,250 mg Intravenous Q12H  . vancomycin  2,000 mg Intravenous Once  . DISCONTD: vancomycin  1,500 mg Intravenous Q24H   Continuous Infusions:   . dextrose 5 % and 0.9% NaCl 125 mL/hr at 01/26/12 0714   PRN Meds:.acetaminophen, acetaminophen, ondansetron (ZOFRAN) IV, ondansetron   Intake:  NPO  Output:   Intake/Output Summary (Last 24 hours) at 01/26/12 1427 Last data filed at 01/26/12 1311  Gross per 24 hour  Intake   2650 ml  Output   1850 ml  Net    800 ml   1 BM daily  Diet Order: NPO  Supplements/Tube Feeding:  None at this time  IVF:    dextrose 5 % and 0.9% NaCl Last Rate: 125 mL/hr at 01/26/12 0714    Estimated Nutritional Needs:   Kcal: 1850-2005 Protein: 80-96g Fluid: ~2.4 L/day  Pt resting at time of visit, bedbound at baseline.  No family at bedside.  Pt on chopped meats, thin liquids at NH.  Residual aphasia and left sided weakness from previous CVA. Failed swallow screen on admission.  Orders for SLP evaluation noted. Pt NPO until seen by SLP.  Plans to return to [facility] at d/c.    Pt may have weighed up to 104 kg in December 2012, unable to confirm this at this time.  Now weighs 94.5 kg which would be a significant 9% loss in 3 months.  No wt loss noted by pt or family on admission.  RD will continue to assess for malnutrition.  No evident wasting or change in functional status from baseline noted.    NUTRITION DIAGNOSIS: -Inadequate oral intake (NI-2.1).  Status: Ongoing  RELATED TO: omission of energy dense foods  AS EVIDENCE BY: pt NPO pending swallowing eval  MONITORING/EVALUATION(Goals): 1.  Food/Beverage; diet advancement per SLP/MD discretion with tolerance.  EDUCATION NEEDS: -No education needs identified at this time  INTERVENTION: 1.  Modify diet; per SLP/MD discretion.    Dietitian #: [phone]  DOCUMENTATION CODES Per approved criteria   -Not Applicable    Loyce Dys [facility] 01/26/2012, 1:12 PM

## 2012-01-26 NOTE — Progress Notes (Signed)
At 1215 Patient was transferred to 1423, with his belongings. Report given to receiving nurse

## 2012-01-26 NOTE — Progress Notes (Signed)
DAILY PROGRESS NOTE                              GENERAL INTERNAL MEDICINE [facility]  SUBJECTIVE: No complaints, denies any shortness of breath or fever.  OBJECTIVE: BP 120/67  Pulse 70  Temp(Src) 97.6 F (36.4 C) (Oral)  Resp 13  Ht 6' (1.829 m)  Wt 94.5 kg (208 lb 5.4 oz)  BMI 28.26 kg/m2  SpO2 100%  Intake/Output Summary (Last 24 hours) at 01/26/12 0842 Last data filed at 01/26/12 0600  Gross per 24 hour  Intake   1975 ml  Output   1400 ml  Net    575 ml                      Weight change:  Physical Exam: General: Alert and awake oriented x3 not in any acute distress. HEENT: anicteric sclera, pupils equal reactive to light and accommodation CVS: S1-S2 heard, no murmur rubs or gallops Chest: clear to auscultation bilaterally, no wheezing rales or rhonchi Abdomen:  normal bowel sounds, soft, nontender, nondistended, no organomegaly Neuro: Cranial nerves II-XII intact, no focal neurological deficits Extremities: no cyanosis, no clubbing or edema noted bilaterally   Lab Results:  [facility] 01/26/12 0340 01/24/12 2358  NA 134* 133*  K 3.8 4.9  CL 104 100  CO2 25 22  GLUCOSE 113* 142*  BUN 30* 27*  CREATININE 1.12 1.30  CALCIUM 8.7 9.6  MG -- --  PHOS -- --    Basename 01/24/12 2358  AST 31  ALT 16  ALKPHOS 47  BILITOT 0.3  PROT 6.8  ALBUMIN 2.8*   No results found for this basename: LIPASE:2,AMYLASE:2 in the last 72 hours  Basename 01/26/12 0340 01/24/12 2358  WBC 7.9 13.4*  NEUTROABS -- 9.4*  HGB 9.8* 14.0  HCT 29.8* 41.2  MCV 87.4 86.6  PLT 303 363    Basename 01/26/12 0033 01/25/12 1825 01/25/12 0850  CKTOTAL 50 43 53  CKMB 3.3 2.5 3.2  CKMBINDEX -- -- --  TROPONINI <0.30 <0.30 <0.30   Micro Results: Recent Results (from the past 240 hour(s))  CULTURE, BLOOD (ROUTINE X 2)     Status: Normal (Preliminary result)   Collection Time   01/25/12  1:13 AM      Component Value Range Status Comment   Specimen Description BLOOD RIGHT WRIST    Final    Special Requests BOTTLES DRAWN AEROBIC AND ANAEROBIC 5CC   Final    Culture  Setup Time [id number]   Final    Culture     Final    Value: GRAM POSITIVE COCCI IN CLUSTERS     Note: Gram Stain Report Called to,Read Back By and Verified With:  BROOKE WHITE @0324  ON 01/26/2012 BY MCLET   Report Status PENDING   Incomplete   CULTURE, BLOOD (ROUTINE X 2)     Status: Normal (Preliminary result)   Collection Time   01/25/12  8:50 AM      Component Value Range Status Comment   Specimen Description BLOOD RIGHT ARM   Final    Special Requests BOTTLES DRAWN AEROBIC AND ANAEROBIC 4CC   Final    Culture  Setup Time [id number]   Final    Culture     Final    Value:        BLOOD CULTURE RECEIVED NO GROWTH TO DATE CULTURE WILL BE HELD FOR 5 DAYS BEFORE  ISSUING A FINAL NEGATIVE REPORT   Report Status PENDING   Incomplete   MRSA PCR SCREENING     Status: Normal   Collection Time   01/25/12  6:03 PM      Component Value Range Status Comment   MRSA by PCR NEGATIVE  NEGATIVE  Final     Studies/Results: Dg Chest Port 1 View  01/25/2012  *RADIOLOGY REPORT*  Clinical Data: Hypertension  PORTABLE CHEST - 1 VIEW  Comparison: 06/15/2009  Findings: There is a tortuous and unfolded thoracic aorta.  Lung volumes are low.  There is an opacity within the right upper lobe and left base suspicious for infiltrate.  IMPRESSION:  1.  Low lung volumes. 2.  Bilateral infiltrates.  Original Report Authenticated By: Rosealee Albee, M.D.   Dg Abd Acute W/chest  01/12/2012  *RADIOLOGY REPORT*  Clinical Data: 71 year old male with abdominal pain and distention.  ACUTE ABDOMEN SERIES (ABDOMEN 2 VIEW & CHEST 1 VIEW)  Comparison: 06/09/2004 CT  Findings: Elevated left hemidiaphragm is again noted with left basilar atelectasis/scarring. Upper limits normal heart size again identified. No definite focal airspace disease, pleural effusion or pneumothorax noted.  Mildly distended gas and fluid filled loops of small bowel,  stomach and colon are noted. Stool in the rectum is identified. There is no evidence of pneumoperitoneum. No suspicious calcifications are present. No acute bony abnormalities are identified.  IMPRESSION: Nonspecific bowel gas pattern with mildly distended gas and fluid filled stomach, small bowel and colon.  This most likely represents a gastroenteritis.  No evidence of pneumoperitoneum.  No evidence of acute cardiopulmonary disease.  Original Report Authenticated By: [name], M.D.   Medications: Scheduled Meds:   . aspirin  325 mg Oral Daily  . docusate sodium  100 mg Oral BID  . DULoxetine  60 mg Oral Daily  . fenofibrate  54 mg Oral Daily  . heparin  5,000 Units Subcutaneous Q8H  . piperacillin-tazobactam (ZOSYN)  IV  3.375 g Intravenous Q8H  . senna  1 tablet Oral BID  . simvastatin  10 mg Oral QHS  . sodium chloride  3 mL Intravenous Q12H  . vancomycin  1,500 mg Intravenous Q24H  . vancomycin  2,000 mg Intravenous Once   Continuous Infusions:   . dextrose 5 % and 0.9% NaCl 125 mL/hr at 01/26/12 0714  . DISCONTD: sodium chloride    . DISCONTD: sodium chloride 125 mL/hr at 01/25/12 0245   PRN Meds:.acetaminophen, acetaminophen, ondansetron (ZOFRAN) IV, ondansetron  ASSESSMENT & PLAN: Principal Problem:  *Sepsis Active Problems:  CEREBROVASCULAR ACCIDENT WITH LEFT HEMIPARESIS  Hypotension  Healthcare-associated pneumonia  Hyponatremia  CKD (chronic kidney disease) stage 2, GFR 60-89 ml/min  Leukocytosis   Pneumonia -Healthcare associated pneumonia, seems bibasilar. -Patient is on Zosyn and vancomycin. Very minimal oxygen requirements.  Sepsis -Likely secondary to the pneumonia. -1/2 of blood cultures positive for GPCs, patient is on vancomycin. -Hypotension, resolved. Vitals are stable.  Anemia -This is likely hemodilution secondary to aggressive IV fluid hydration. -I'll monitor closely, check CBC in the morning.  CKD -Mild chronic kidney disease, likely  stage 2-3 -Creatinine at its baseline 1.1.  History of CVA -Residual left-sided weakness  Dysphagia -Secondary to the previous CVA. Patient failed swallow screen in the emergency department. -Currently n.p.o. awaiting for swallow evaluation by SLP.  Disposition -I will transfer patient to telemetry bed.   LOS: 2 days   Austine Wiedeman A 01/26/2012, 8:43 AM

## 2012-01-26 NOTE — Progress Notes (Signed)
ANTIBIOTIC CONSULT NOTE - FOLLOW UP  Pharmacy Consult for Vanco and Zosyn Indication: HCAP  No Known Allergies  Patient Measurements: Height: 6' (182.9 cm) Weight: 208 lb 5.4 oz (94.5 kg) IBW/kg (Calculated) : 77.6   Vital Signs: Temp: 97.3 F (36.3 C) (03/22 0800) Temp src: Oral (03/22 0800) BP: 125/62 mmHg (03/22 1000) Pulse Rate: 64  (03/22 1000) Intake/Output from previous day: 03/21 0701 - 03/22 0700 In: 2100 [I.V.:1500; IV Piggyback:600] Out: 1400 [Urine:1400] Intake/Output from this shift: Total I/O In: 550 [I.V.:500; IV Piggyback:50] Out: 450 [Urine:450]  Labs:  [facility] 01/26/12 0340 01/24/12 2358  WBC 7.9 13.4*  HGB 9.8* 14.0  PLT 303 363  LABCREA -- --  CREATININE 1.12 1.30   Estimated Creatinine Clearance: 73.3 ml/min (by C-G formula based on Cr of 1.12). No results found for this basename: VANCOTROUGH:2,VANCOPEAK:2,VANCORANDOM:2,GENTTROUGH:2,GENTPEAK:2,GENTRANDOM:2,TOBRATROUGH:2,TOBRAPEAK:2,TOBRARND:2,AMIKACINPEAK:2,AMIKACINTROU:2,AMIKACIN:2, in the last 72 hours   Microbiology: Recent Results (from the past 720 hour(s))  CULTURE, BLOOD (ROUTINE X 2)     Status: Normal (Preliminary result)   Collection Time   01/25/12  1:13 AM      Component Value Range Status Comment   Specimen Description BLOOD RIGHT WRIST   Final    Special Requests BOTTLES DRAWN AEROBIC AND ANAEROBIC 5CC   Final    Culture  Setup Time [id number]   Final    Culture     Final    Value: GRAM POSITIVE COCCI IN CLUSTERS     Note: Gram Stain Report Called to,Read Back By and Verified With:  BROOKE WHITE @0324  ON 01/26/2012 BY MCLET   Report Status PENDING   Incomplete   CULTURE, BLOOD (ROUTINE X 2)     Status: Normal (Preliminary result)   Collection Time   01/25/12  8:50 AM      Component Value Range Status Comment   Specimen Description BLOOD RIGHT ARM   Final    Special Requests BOTTLES DRAWN AEROBIC AND ANAEROBIC 4CC   Final    Culture  Setup Time [id number]   Final    Culture     Final    Value:        BLOOD CULTURE RECEIVED NO GROWTH TO DATE CULTURE WILL BE HELD FOR 5 DAYS BEFORE ISSUING A FINAL NEGATIVE REPORT   Report Status PENDING   Incomplete   URINE CULTURE     Status: Normal   Collection Time   01/25/12  8:53 AM      Component Value Range Status Comment   Specimen Description URINE, CATHETERIZED   Final    Special Requests NONE   Final    Culture  Setup Time [id number]   Final    Colony Count NO GROWTH   Final    Culture NO GROWTH   Final    Report Status 01/26/2012 FINAL   Final   MRSA PCR SCREENING     Status: Normal   Collection Time   01/25/12  6:03 PM      Component Value Range Status Comment   MRSA by PCR NEGATIVE  NEGATIVE  Final     Anti-infectives     Start     Dose/Rate Route Frequency Ordered Stop   01/26/12 2000   vancomycin (VANCOCIN) 1,500 mg in sodium chloride 0.9 % 500 mL IVPB        1,500 mg 250 mL/hr over 120 Minutes Intravenous Every 24 hours 01/25/12 1837     01/25/12 2000  piperacillin-tazobactam (ZOSYN) IVPB 3.375 g  3.375 g 12.5 mL/hr over 240 Minutes Intravenous Every 8 hours 01/25/12 1837     01/25/12 1930   vancomycin (VANCOCIN) 2,000 mg in sodium chloride 0.9 % 500 mL IVPB        2,000 mg 250 mL/hr over 120 Minutes Intravenous  Once 01/25/12 1837 01/25/12 2227   01/25/12 0145   vancomycin (VANCOCIN) IVPB 1000 mg/200 mL premix        1,000 mg 200 mL/hr over 60 Minutes Intravenous  Once 01/25/12 0135 01/25/12 0346   01/25/12 0145  piperacillin-tazobactam (ZOSYN) 3.375 g in dextrose 5 % 50 mL IVPB       3.375 g 100 mL/hr over 30 Minutes Intravenous  Once 01/25/12 0135 01/25/12 0255          Assessment:  70 YOM nursing home resident presents to ER with hypotension, leukocytosis, with abnormal CXR. Started on broad spectrum antibiotics 3/21 for presumed sepsis secondary to PNA. Weight has been updated in chart, will readjust Vanco dose to reflect this; Zosyn dose remains appropriate. WBC trending  down. 1 of 2 BCx now growing GPC clusters (most likely covered by Vanco) - await species/sensitivities.  Goal of Therapy:  Vancomycin trough level 15-20 mcg/ml  Plan:  1) Change Vanco to 1250mg  IV q12h 2) Continue current Zosyn dose. 3) F/U blood Cx species/sensitivities.  Darrol Angel, PharmD Pager: [phone] 01/26/2012,1:37 PM

## 2012-01-26 NOTE — Progress Notes (Addendum)
Call received from lab concerning pt blood cultures.  Blood cultures from 3/21 aerobic bottle contained gram positive cocci in clusters.  Dr. Maisie Fus from [facility] notified via text page. No orders received at this time. Pt. Already on Vanc and Zosyn IV.       Cosigned per [facility] RN 3/22 0423

## 2012-01-26 NOTE — Progress Notes (Signed)
CARE MANAGEMENT NOTE 01/26/2012  Patient:  Isaiah [name], Isaiah [name]   Account Number:  [id number]  Date Initiated:  01/26/2012  Documentation initiated by:  Juandiego Kolenovic  Subjective/Objective Assessment:   pt with cap     Action/Plan:   snf   Anticipated DC Date:  01/29/2012   Anticipated DC Plan:  SKILLED NURSING FACILITY         Choice offered to / List presented to:             Status of service:  In process, will continue to follow Medicare Important Message given?   (If response is "NO", the following Medicare IM given date fields will be blank) Date Medicare IM given:   Date Additional Medicare IM given:    Discharge Disposition:    Per UR Regulation:  Reviewed for med. necessity/level of care/duration of stay  If discussed at Long Length of Stay Meetings, dates discussed:    Comments:

## 2012-01-27 LAB — BASIC METABOLIC PANEL
BUN: 24 mg/dL — ABNORMAL HIGH (ref 6–23)
CO2: 25 mEq/L (ref 19–32)
Chloride: 110 mEq/L (ref 96–112)
Creatinine, Ser: 1.11 mg/dL (ref 0.50–1.35)
GFR calc Af Amer: 76 mL/min — ABNORMAL LOW (ref >90)
Glucose, Bld: 100 mg/dL — ABNORMAL HIGH (ref 70–99)

## 2012-01-27 LAB — CBC
HCT: 23.9 % — ABNORMAL LOW (ref 39.0–52.0)
MCH: 28.6 pg (ref 26.0–34.0)
MCHC: 32.2 g/dL (ref 30.0–36.0)
MCV: 88.8 fL (ref 78.0–100.0)
RDW: 14.3 % (ref 11.5–15.5)

## 2012-01-27 LAB — CULTURE, BLOOD (ROUTINE X 2): Culture  Setup Time: 201303210459

## 2012-01-27 MED ORDER — PANTOPRAZOLE SODIUM 40 MG IV SOLR
40.0000 mg | Freq: Two times a day (BID) | INTRAVENOUS | Status: DC
  Administered 2012-01-27 – 2012-01-30 (×7): 40 mg via INTRAVENOUS
  Filled 2012-01-27 (×8): qty 40

## 2012-01-27 NOTE — Progress Notes (Signed)
DAILY PROGRESS NOTE                              GENERAL INTERNAL MEDICINE [facility]  SUBJECTIVE: No complaints, not eating well, complaining about no appetite.  OBJECTIVE: BP 126/76  Pulse 67  Temp(Src) 97.6 F (36.4 C) (Oral)  Resp 20  Ht 6' (1.829 m)  Wt 94.5 kg (208 lb 5.4 oz)  BMI 28.26 kg/m2  SpO2 100%  Intake/Output Summary (Last 24 hours) at 01/27/12 1104 Last data filed at 01/27/12 1004  Gross per 24 hour  Intake   3130 ml  Output   1500 ml  Net   1630 ml                      Weight change:  Physical Exam: General: Alert and awake oriented x3 not in any acute distress. HEENT: anicteric sclera, pupils equal reactive to light and accommodation CVS: S1-S2 heard, no murmur rubs or gallops Chest: clear to auscultation bilaterally, no wheezing rales or rhonchi Abdomen:  normal bowel sounds, soft, nontender, nondistended, no organomegaly Neuro: Cranial nerves II-XII intact, no focal neurological deficits Extremities: no cyanosis, no clubbing or edema noted bilaterally   Lab Results:  Basename 01/27/12 0505 01/26/12 0340  NA 140 134*  K 3.8 3.8  CL 110 104  CO2 25 25  GLUCOSE 100* 113*  BUN 24* 30*  CREATININE 1.11 1.12  CALCIUM 8.6 8.7  MG -- --  PHOS -- --    Basename 01/24/12 2358  AST 31  ALT 16  ALKPHOS 47  BILITOT 0.3  PROT 6.8  ALBUMIN 2.8*   No results found for this basename: LIPASE:2,AMYLASE:2 in the last 72 hours  Basename 01/27/12 0505 01/26/12 0340 01/24/12 2358  WBC 8.2 7.9 --  NEUTROABS -- -- 9.4*  HGB 7.7* 9.8* --  HCT 23.9* 29.8* --  MCV 88.8 87.4 --  PLT 312 303 --    Basename 01/26/12 0033 01/25/12 1825 01/25/12 0850  CKTOTAL 50 43 53  CKMB 3.3 2.5 3.2  CKMBINDEX -- -- --  TROPONINI <0.30 <0.30 <0.30   Micro Results: Recent Results (from the past 240 hour(s))  CULTURE, BLOOD (ROUTINE X 2)     Status: Normal (Preliminary result)   Collection Time   01/25/12  1:13 AM      Component Value Range Status Comment   Specimen Description BLOOD RIGHT WRIST   Final    Special Requests BOTTLES DRAWN AEROBIC AND ANAEROBIC 5CC   Final    Culture  Setup Time [id number]   Final    Culture     Final    Value: GRAM POSITIVE COCCI IN CLUSTERS     Note: Gram Stain Report Called to,Read Back By and Verified With:  BROOKE WHITE @0324  ON 01/26/2012 BY MCLET   Report Status PENDING   Incomplete   CULTURE, BLOOD (ROUTINE X 2)     Status: Normal (Preliminary result)   Collection Time   01/25/12  8:50 AM      Component Value Range Status Comment   Specimen Description BLOOD RIGHT ARM   Final    Special Requests BOTTLES DRAWN AEROBIC AND ANAEROBIC 4CC   Final    Culture  Setup Time [id number]   Final    Culture     Final    Value:        BLOOD CULTURE RECEIVED NO GROWTH TO DATE CULTURE WILL  BE HELD FOR 5 DAYS BEFORE ISSUING A FINAL NEGATIVE REPORT   Report Status PENDING   Incomplete   URINE CULTURE     Status: Normal   Collection Time   01/25/12  8:53 AM      Component Value Range Status Comment   Specimen Description URINE, CATHETERIZED   Final    Special Requests NONE   Final    Culture  Setup Time [id number]   Final    Colony Count NO GROWTH   Final    Culture NO GROWTH   Final    Report Status 01/26/2012 FINAL   Final   MRSA PCR SCREENING     Status: Normal   Collection Time   01/25/12  6:03 PM      Component Value Range Status Comment   MRSA by PCR NEGATIVE  NEGATIVE  Final     Studies/Results: Dg Chest Port 1 View  01/25/2012  *RADIOLOGY REPORT*  Clinical Data: Hypertension  PORTABLE CHEST - 1 VIEW  Comparison: 06/15/2009  Findings: There is a tortuous and unfolded thoracic aorta.  Lung volumes are low.  There is an opacity within the right upper lobe and left base suspicious for infiltrate.  IMPRESSION:  1.  Low lung volumes. 2.  Bilateral infiltrates.  Original Report Authenticated By: Rosealee Albee, M.D.   Dg Abd Acute W/chest  01/12/2012  *RADIOLOGY REPORT*  Clinical Data: 71 year old male  with abdominal pain and distention.  ACUTE ABDOMEN SERIES (ABDOMEN 2 VIEW & CHEST 1 VIEW)  Comparison: 06/09/2004 CT  Findings: Elevated left hemidiaphragm is again noted with left basilar atelectasis/scarring. Upper limits normal heart size again identified. No definite focal airspace disease, pleural effusion or pneumothorax noted.  Mildly distended gas and fluid filled loops of small bowel, stomach and colon are noted. Stool in the rectum is identified. There is no evidence of pneumoperitoneum. No suspicious calcifications are present. No acute bony abnormalities are identified.  IMPRESSION: Nonspecific bowel gas pattern with mildly distended gas and fluid filled stomach, small bowel and colon.  This most likely represents a gastroenteritis.  No evidence of pneumoperitoneum.  No evidence of acute cardiopulmonary disease.  Original Report Authenticated By: [name], M.D.   Medications: Scheduled Meds:    . aspirin  325 mg Oral Daily  . docusate sodium  100 mg Oral BID  . DULoxetine  60 mg Oral Daily  . fenofibrate  54 mg Oral Daily  . heparin  5,000 Units Subcutaneous Q8H  . piperacillin-tazobactam (ZOSYN)  IV  3.375 g Intravenous Q8H  . senna  1 tablet Oral BID  . simvastatin  10 mg Oral QHS  . sodium chloride  3 mL Intravenous Q12H  . vancomycin  1,250 mg Intravenous Q12H  . DISCONTD: vancomycin  1,500 mg Intravenous Q24H   Continuous Infusions:    . dextrose 5 % and 0.9% NaCl 125 mL/hr at 01/26/12 1459   PRN Meds:.acetaminophen, acetaminophen, ondansetron (ZOFRAN) IV, ondansetron  ASSESSMENT & PLAN: Principal Problem:  *Sepsis Active Problems:  CEREBROVASCULAR ACCIDENT WITH LEFT HEMIPARESIS  Hypotension  Healthcare-associated pneumonia  Hyponatremia  CKD (chronic kidney disease) stage 2, GFR 60-89 ml/min  Leukocytosis   Pneumonia -Healthcare associated pneumonia, seems bibasilar. -Patient is on Zosyn and vancomycin. Very minimal oxygen requirements.  Sepsis -Likely  secondary to the pneumonia. -1/2 of blood cultures positive for GPCs, patient is on vancomycin. -Hypotension, resolved. Vitals are stable.  Anemia -Marked drop in the hemoglobin from 14.0 to 9.8 and then 7.7 this  morning. -Felt initially to be hemodilution, but this is almost 7 g drop. -I will order for a fecal occult blood testing, and all consult gastroenterology for assistance.  CKD -Mild chronic kidney disease, likely stage 2-3 -Creatinine at its baseline 1.1.  History of CVA -Residual left-sided weakness  Dysphagia -Secondary to the previous CVA. Patient failed swallow screen in the emergency department. -Currently n.p.o. awaiting for swallow evaluation by SLP.  Disposition -Remain as inpatient, telemetry bed.   LOS: 3 days   Sabastian Raimondi A 01/27/2012, 11:04 AM

## 2012-01-27 NOTE — ED Provider Notes (Signed)
Medical screening examination/treatment/procedure(s) were conducted as a shared visit with non-physician practitioner(s) and myself.  I personally evaluated the patient during the encounter.  The patient was seen by me along with L. Paz.  He was sent from an ecf for eval of low blood pressure.  He is somewhat a poor historian, but denies any pain, shortness of breath, or other symptoms.  The family present at bedside say is somewhat more somnolent and less energetic than normal.  He has an extensive pmh including cva, htn, colon ca.  On exam, vital signs reveal him to be hypotensive with bp in the 80-90's systolic.  The heart and lung exam is essentially unremarkable and the abdomen is benign.  Workup reveals a leukocytosis with what appear to be bilateral infiltrates on chest xray.  He was given appropriate antibiotics for health care associated pneumonia and internal medicine was consulted for admission.    CRITICAL CARE Performed by: Geoffery Lyons   Total critical care time: 30 minutes  Critical care time was exclusive of separately billable procedures and treating other patients.  Critical care was necessary to treat or prevent imminent or life-threatening deterioration.  Critical care was time spent personally by me on the following activities: development of treatment plan with patient and/or surrogate as well as nursing, discussions with consultants, evaluation of patient's response to treatment, examination of patient, obtaining history from patient or surrogate, ordering and performing treatments and interventions, ordering and review of laboratory studies, ordering and review of radiographic studies, pulse oximetry and re-evaluation of patient's condition.   Geoffery Lyons, MD 01/27/12 1151

## 2012-01-27 NOTE — Progress Notes (Signed)
Stool sent for occult blood. No visible blood noted.

## 2012-01-27 NOTE — Consult Note (Signed)
Reason for Consult:CROSS COVER [facility] Referring Physician: THP  [name] is an 71 y.o. male.  HPI: GI consultation is being requested for a significant drop in hemoglobin to 7.7 gm/dl Patient has had a right anterior cerebellar artery stroke in 2010.  There is no sign of an overt GI bleed. He denies having any nausea, vomiting, diarrhea or abdominal pain. He suffers from chronic constipation. He denies a history of melena or hematochezia. He claims he has a fairly good appetite and his weight has been stable. He is also due for a repeat colonoscopy in April this year. Past Medical History  Diagnosis Date  . Hypertension   . Hypercholesterolemia   . 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 Bradycardia 06/2009    HR 30-40's with decreased consciousness  . Renal disorder   . Alcohol use   . GI bleed     H/o hematochezia   . Syncope   . History of colonic polyps     Past Surgical History  Procedure Date  . Orif left tibia fracture   . Colonoscopy with polypectomy 2001    Family History  Problem Relation Age of Onset  . Colon cancer Neg Hx     Social History:  reports that he quit smoking about 12 months ago. He has never used smokeless tobacco. He reports that he drinks alcohol. His drug history not on file.  Allergies: No Known Allergies  Medications: I have reviewed the patient's current medications.  Results for orders placed during the hospital encounter of 01/24/12 (from the past 48 hour(s))  MRSA PCR SCREENING     Status: Normal   Collection Time   01/25/12  6:03 PM      Component Value Range Comment   MRSA by PCR NEGATIVE  NEGATIVE    CARDIAC PANEL(CRET KIN+CKTOT+MB+TROPI)     Status: Normal   Collection Time   01/25/12  6:25 PM      Component Value Range Comment   Total CK 43  7 - 232 (U/L)    CK, MB 2.5  0.3 - 4.0 (ng/mL)    Troponin I <0.30  <0.30  (ng/mL)    Relative Index RELATIVE INDEX IS INVALID  0.0 - 2.5    CARDIAC PANEL(CRET KIN+CKTOT+MB+TROPI)     Status: Normal   Collection Time   01/26/12 12:33 AM      Component Value Range Comment   Total CK 50  7 - 232 (U/L)    CK, MB 3.3  0.3 - 4.0 (ng/mL)    Troponin I <0.30  <0.30 (ng/mL)    Relative Index RELATIVE INDEX IS INVALID  0.0 - 2.5    CBC     Status: Abnormal   Collection Time   01/26/12  3:40 AM      Component Value Range Comment   WBC 7.9  4.0 - 10.5 (K/uL)    RBC 3.41 (*) 4.22 - 5.81 (MIL/uL)    Hemoglobin 9.8 (*) 13.0 - 17.0 (g/dL)    HCT 91.4 (*) 78.2 - 52.0 (%)    MCV 87.4  78.0 - 100.0 (fL)    MCH 28.7  26.0 - 34.0 (pg)    MCHC 32.9  30.0 - 36.0 (g/dL)    RDW 95.6  21.3 - 08.6 (%)    Platelets 303  150 - 400 (K/uL)   BASIC METABOLIC PANEL  Status: Abnormal   Collection Time   01/26/12  3:40 AM      Component Value Range Comment   Sodium 134 (*) 135 - 145 (mEq/L)    Potassium 3.8  3.5 - 5.1 (mEq/L) DELTA CHECK NOTED   Chloride 104  96 - 112 (mEq/L)    CO2 25  19 - 32 (mEq/L)    Glucose, Bld 113 (*) 70 - 99 (mg/dL)    BUN 30 (*) 6 - 23 (mg/dL)    Creatinine, Ser 3.08  0.50 - 1.35 (mg/dL)    Calcium 8.7  8.4 - 10.5 (mg/dL)    GFR calc non Af Amer 65 (*) >90 (mL/min)    GFR calc Af Amer 75 (*) >90 (mL/min)   GLUCOSE, CAPILLARY     Status: Abnormal   Collection Time   01/26/12  8:34 AM      Component Value Range Comment   Glucose-Capillary 107 (*) 70 - 99 (mg/dL)    Comment 1 Documented in Chart      Comment 2 Notify RN     BASIC METABOLIC PANEL     Status: Abnormal   Collection Time   01/27/12  5:05 AM      Component Value Range Comment   Sodium 140  135 - 145 (mEq/L)    Potassium 3.8  3.5 - 5.1 (mEq/L)    Chloride 110  96 - 112 (mEq/L)    CO2 25  19 - 32 (mEq/L)    Glucose, Bld 100 (*) 70 - 99 (mg/dL)    BUN 24 (*) 6 - 23 (mg/dL)    Creatinine, Ser 6.57  0.50 - 1.35 (mg/dL)    Calcium 8.6  8.4 - 10.5 (mg/dL)    GFR calc non Af Amer 65 (*) >90  (mL/min)    GFR calc Af Amer 76 (*) >90 (mL/min)   CBC     Status: Abnormal   Collection Time   01/27/12  5:05 AM      Component Value Range Comment   WBC 8.2  4.0 - 10.5 (K/uL)    RBC 2.69 (*) 4.22 - 5.81 (MIL/uL)    Hemoglobin 7.7 (*) 13.0 - 17.0 (g/dL)    HCT 84.6 (*) 96.2 - 52.0 (%)    MCV 88.8  78.0 - 100.0 (fL)    MCH 28.6  26.0 - 34.0 (pg)    MCHC 32.2  30.0 - 36.0 (g/dL)    RDW 95.2  84.1 - 32.4 (%)    Platelets 312  150 - 400 (K/uL)     No results found.  Review of Systems  Constitutional: Positive for malaise/fatigue. Negative for fever, chills, weight loss and diaphoresis.  Respiratory: Negative for cough, hemoptysis, sputum production, shortness of breath and wheezing.   Cardiovascular: Negative for chest pain, palpitations, orthopnea, claudication and leg swelling.  Gastrointestinal: Positive for heartburn, constipation and blood in stool. Negative for nausea, vomiting, abdominal pain, diarrhea and melena.  Genitourinary: Negative for dysuria, urgency, frequency, hematuria and flank pain.  Musculoskeletal: Positive for back pain and joint pain. Negative for myalgias.  Skin: Negative for itching and rash.  Neurological: Positive for focal weakness and weakness.  Psychiatric/Behavioral: Positive for depression and memory loss. Negative for hallucinations. The patient is nervous/anxious.    Blood pressure 110/68, pulse 74, temperature 97.5 F (36.4 C), temperature source Axillary, resp. rate 19, height 6' (1.829 m), weight 94.5 kg (208 lb 5.4 oz), SpO2 95.00%. Physical Exam  Constitutional: He is oriented to person, place,  and time. He appears well-developed and well-nourished.  HENT:  Head: Normocephalic and atraumatic.  Eyes: EOM are normal.  Neck: Normal range of motion. Neck supple. No thyromegaly present.  Cardiovascular: Normal rate, regular rhythm and normal heart sounds.   Respiratory: Breath sounds normal. No respiratory distress. He has no wheezes. He has no  rales. He exhibits no tenderness.  GI: Soft. Bowel sounds are normal. He exhibits no distension and no mass. There is no tenderness. There is no rebound and no guarding.  Musculoskeletal: He exhibits no edema and no tenderness.  Neurological: He is alert and oriented to person, place, and time.  Skin: Skin is warm.    Assessment/Plan: 1) Anemia of unclear etiology; will monitor serial CBC's and schedule the patient for an EGD/Colonoscopy on Monday 01/29/12.  2) GERD: On PPI's.  3) Chronic constipation: On Senna. 4) S/P ACA stroke in 2010.  5) Pneumonia: On Vancomycin and Zosyn.   Isaiah [name] 01/27/2012, 3:28 PM

## 2012-01-28 LAB — FOLATE: Folate: 13.5 ng/mL

## 2012-01-28 LAB — BASIC METABOLIC PANEL
Chloride: 110 mEq/L (ref 96–112)
GFR calc non Af Amer: 61 mL/min — ABNORMAL LOW (ref >90)
Glucose, Bld: 120 mg/dL — ABNORMAL HIGH (ref 70–99)
Potassium: 3.4 mEq/L — ABNORMAL LOW (ref 3.5–5.1)
Sodium: 140 mEq/L (ref 135–145)

## 2012-01-28 LAB — CBC
Hemoglobin: 6.9 g/dL — CL (ref 13.0–17.0)
Hemoglobin: 7.3 g/dL — ABNORMAL LOW (ref 13.0–17.0)
MCHC: 32.2 g/dL (ref 30.0–36.0)
MCHC: 33.7 g/dL (ref 30.0–36.0)
MCV: 88.9 fL (ref 78.0–100.0)
Platelets: 266 10*3/uL (ref 150–400)
RBC: 2.34 MIL/uL — ABNORMAL LOW (ref 4.22–5.81)
RBC: 2.46 MIL/uL — ABNORMAL LOW (ref 4.22–5.81)
RDW: 14.7 % (ref 11.5–15.5)
WBC: 6.9 10*3/uL (ref 4.0–10.5)
WBC: 9.3 10*3/uL (ref 4.0–10.5)

## 2012-01-28 LAB — VITAMIN B12: Vitamin B-12: 418 pg/mL (ref 211–911)

## 2012-01-28 LAB — DIFFERENTIAL
Lymphs Abs: 2.8 10*3/uL (ref 0.7–4.0)
Monocytes Relative: 8 % (ref 3–12)
Neutro Abs: 4.5 10*3/uL (ref 1.7–7.7)
Neutrophils Relative %: 56 % (ref 43–77)

## 2012-01-28 LAB — RETICULOCYTES
RBC.: 2.46 MIL/uL — ABNORMAL LOW (ref 4.22–5.81)
Retic Count, Absolute: 155 10*3/uL (ref 19.0–186.0)

## 2012-01-28 LAB — GLUCOSE, CAPILLARY: Glucose-Capillary: 88 mg/dL (ref 70–99)

## 2012-01-28 LAB — OCCULT BLOOD X 1 CARD TO LAB, STOOL: Fecal Occult Bld: POSITIVE

## 2012-01-28 LAB — IRON AND TIBC: Saturation Ratios: 26 % (ref 20–55)

## 2012-01-28 NOTE — Progress Notes (Signed)
DAILY PROGRESS NOTE                              [facility]  SUBJECTIVE: Denies any complaints, hemoglobin dropped even further. Gastroenterology is following.  OBJECTIVE: BP 99/66  Pulse 63  Temp(Src) 96.6 F (35.9 C) (Axillary)  Resp 20  Ht 6' (1.829 m)  Wt 94.5 kg (208 lb 5.4 oz)  BMI 28.26 kg/m2  SpO2 100%  Intake/Output Summary (Last 24 hours) at 01/28/12 1127 Last data filed at 01/28/12 0747  Gross per 24 hour  Intake 2140.42 ml  Output   2025 ml  Net 115.42 ml                      Weight change:  Physical Exam: General: Alert and awake oriented x3 not in any acute distress. HEENT: anicteric sclera, pupils equal reactive to light and accommodation CVS: S1-S2 heard, no murmur rubs or gallops Chest: clear to auscultation bilaterally, no wheezing rales or rhonchi Abdomen:  normal bowel sounds, soft, nontender, nondistended, no organomegaly Neuro: Cranial nerves II-XII intact, no focal neurological deficits Extremities: no cyanosis, no clubbing or edema noted bilaterally   Lab Results:  [facility] 01/28/12 0515 01/27/12 0505  NA 140 140  K 3.4* 3.8  CL 110 110  CO2 26 25  GLUCOSE 120* 100*  BUN 12 24*  CREATININE 1.18 1.11  CALCIUM 8.5 8.6  MG -- --  PHOS -- --   Basename 01/28/12 0616 01/28/12 0515  WBC 8.0 6.9  NEUTROABS 4.5 --  HGB 7.3* 6.9*  HCT 22.2* 21.1*  MCV 90.2 90.2  PLT 287 262    Basename 01/26/12 0033 01/25/12 1825  CKTOTAL 50 43  CKMB 3.3 2.5  CKMBINDEX -- --  TROPONINI <0.30 <0.30   Micro Results: Recent Results (from the past 240 hour(s))  CULTURE, BLOOD (ROUTINE X 2)     Status: Normal   Collection Time   01/25/12  1:13 AM      Component Value Range Status Comment   Specimen Description BLOOD RIGHT WRIST   Final    Special Requests BOTTLES DRAWN AEROBIC AND ANAEROBIC 5CC   Final    Culture  Setup Time [id number]   Final    Culture     Final    Value: STAPHYLOCOCCUS SPECIES (COAGULASE  NEGATIVE)     Note: THE SIGNIFICANCE OF ISOLATING THIS ORGANISM FROM A SINGLE SET OF BLOOD CULTURES WHEN MULTIPLE SETS ARE DRAWN IS UNCERTAIN. PLEASE NOTIFY THE MICROBIOLOGY DEPARTMENT WITHIN ONE WEEK IF SPECIATION AND SENSITIVITIES ARE REQUIRED.     Note: Gram Stain Report Called to,Read Back By and Verified With:  BROOKE WHITE @0324  ON 01/26/2012 BY MCLET   Report Status 01/27/2012 FINAL   Final   CULTURE, BLOOD (ROUTINE X 2)     Status: Normal (Preliminary result)   Collection Time   01/25/12  8:50 AM      Component Value Range Status Comment   Specimen Description BLOOD RIGHT ARM   Final    Special Requests BOTTLES DRAWN AEROBIC AND ANAEROBIC 4CC   Final    Culture  Setup Time [id number]   Final    Culture     Final    Value:        BLOOD CULTURE RECEIVED NO GROWTH TO DATE CULTURE WILL BE HELD FOR 5 DAYS BEFORE ISSUING A FINAL NEGATIVE REPORT   Report Status PENDING  Incomplete   URINE CULTURE     Status: Normal   Collection Time   01/25/12  8:53 AM      Component Value Range Status Comment   Specimen Description URINE, CATHETERIZED   Final    Special Requests NONE   Final    Culture  Setup Time [id number]   Final    Colony Count NO GROWTH   Final    Culture NO GROWTH   Final    Report Status 01/26/2012 FINAL   Final   MRSA PCR SCREENING     Status: Normal   Collection Time   01/25/12  6:03 PM      Component Value Range Status Comment   MRSA by PCR NEGATIVE  NEGATIVE  Final     Studies/Results: Dg Chest Port 1 View  01/25/2012  *RADIOLOGY REPORT*  Clinical Data: Hypertension  PORTABLE CHEST - 1 VIEW  Comparison: 06/15/2009  Findings: There is a tortuous and unfolded thoracic aorta.  Lung volumes are low.  There is an opacity within the right upper lobe and left base suspicious for infiltrate.  IMPRESSION:  1.  Low lung volumes. 2.  Bilateral infiltrates.  Original Report Authenticated By: Rosealee Albee, M.D.   Dg Abd Acute W/chest  01/12/2012  *RADIOLOGY REPORT*  Clinical  Data: 71 year old male with abdominal pain and distention.  ACUTE ABDOMEN SERIES (ABDOMEN 2 VIEW & CHEST 1 VIEW)  Comparison: 06/09/2004 CT  Findings: Elevated left hemidiaphragm is again noted with left basilar atelectasis/scarring. Upper limits normal heart size again identified. No definite focal airspace disease, pleural effusion or pneumothorax noted.  Mildly distended gas and fluid filled loops of small bowel, stomach and colon are noted. Stool in the rectum is identified. There is no evidence of pneumoperitoneum. No suspicious calcifications are present. No acute bony abnormalities are identified.  IMPRESSION: Nonspecific bowel gas pattern with mildly distended gas and fluid filled stomach, small bowel and colon.  This most likely represents a gastroenteritis.  No evidence of pneumoperitoneum.  No evidence of acute cardiopulmonary disease.  Original Report Authenticated By: [name], M.D.   Medications: Scheduled Meds:    . docusate sodium  100 mg Oral BID  . DULoxetine  60 mg Oral Daily  . fenofibrate  54 mg Oral Daily  . pantoprazole (PROTONIX) IV  40 mg Intravenous Q12H  . piperacillin-tazobactam (ZOSYN)  IV  3.375 g Intravenous Q8H  . senna  1 tablet Oral BID  . simvastatin  10 mg Oral QHS  . sodium chloride  3 mL Intravenous Q12H  . vancomycin  1,250 mg Intravenous Q12H   Continuous Infusions:    . dextrose 5 % and 0.9% NaCl 125 mL/hr at 01/27/12 2053   PRN Meds:.acetaminophen, acetaminophen, ondansetron (ZOFRAN) IV, ondansetron  ASSESSMENT & PLAN: Principal Problem:  *Sepsis Active Problems:  CEREBROVASCULAR ACCIDENT WITH LEFT HEMIPARESIS  Hypotension  Healthcare-associated pneumonia  Hyponatremia  CKD (chronic kidney disease) stage 2, GFR 60-89 ml/min  Leukocytosis   Pneumonia -Healthcare associated pneumonia, seems bibasilar. -Patient is on Zosyn and vancomycin. Very minimal oxygen requirements.  Sepsis -Likely secondary to the pneumonia. -1/2 of blood  cultures positive for coagulase negative staph, I think its contaminant. -Hypotension, resolved. Vitals are stable.  Anemia -Marked drop in the hemoglobin from 14.0 to 9.8 and then 7.7. -Felt initially to be hemodilution, but this is almost 7 g drop. -Will transfuse 2 units of blood. -Positive FOBT, GI following.  CKD -Mild chronic kidney disease, likely stage 2-3 -Creatinine  at its baseline 1.1.  History of CVA -Residual left-sided weakness  Dysphagia -Secondary to the previous CVA. Patient failed swallow screen in the emergency department. -Currently n.p.o. awaiting for swallow evaluation by SLP.  Disposition -Remains as inpatient, telemetry bed.   LOS: 4 days   Brance Dartt A 01/28/2012, 11:27 AM

## 2012-01-28 NOTE — Progress Notes (Signed)
Clinical Social Work Department BRIEF PSYCHOSOCIAL ASSESSMENT 01/28/2012  Patient:  Isaiah [name], Isaiah [name]     Account Number:  [id number]     Admit date:  01/24/2012  Clinical Social Worker:  Robin Searing  Date/Time:  01/28/2012 10:46 AM  Referred by:  Physician  Date Referred:  01/26/2012 Referred for  SNF Placement   Other Referral:   Interview type:  Patient Other interview type:    PSYCHOSOCIAL DATA Living Status:  FACILITY Admitted from facility:  [facility] Level of care:  Skilled Nursing Facility Primary support name:  Shirlee Limerick Primary support relationship to patient:  FAMILY Degree of support available:   good    CURRENT CONCERNS Current Concerns  None Noted   Other Concerns:    SOCIAL WORK ASSESSMENT / PLAN Patient tells me he has resided at [facility] for [facility] years- he is plannign return at d/c.   Assessment/plan status:  Other - See comment Other assessment/ plan:   Plan return to SNF at d/c.   Information/referral to community resources:    PATIENT'S/FAMILY'S RESPONSE TO PLAN OF CARE: Patient agreeable to plans for return to SNF at d/c.

## 2012-01-28 NOTE — Progress Notes (Signed)
Subjective: Cross cover [facility]-GI Since I last evaluated the patient, he has had a drop in his hemoglobin with melenic stools. He received 2 units of PRBC's today. He ate regular food today. He denies any GI comp[laints.i  Objective: Vital signs in last 24 hours: Temp:  [96.6 F (35.9 C)-98.4 F (36.9 C)] 98.4 F (36.9 C) (03/24 1815) Pulse Rate:  [62-74] 74  (03/24 1815) Resp:  [18-20] 20  (03/24 1815) BP: (97-121)/(60-72) 108/69 mmHg (03/24 1815) SpO2:  [99 %-100 %] 100 % (03/24 0720) Last BM Date: 01/26/12  Intake/Output from previous day: 03/23 0701 - 03/24 0700 In: 2950.4 [P.O.:440; I.V.:1860.4; IV Piggyback:650] Out: 1325 [Urine:1325] Intake/Output this shift:   General appearance: cooperative, appears older than stated age and no distress Resp: clear to auscultation bilaterally Cardio: regular rate and rhythm, S1, S2 normal, no murmur, click, rub or gallop GI: soft, non-tender; bowel sounds normal; no masses,  no organomegaly  Lab Results:  [facility] 01/28/12 0616 01/28/12 0515 01/27/12 0505  WBC 8.0 6.9 8.2  HGB 7.3* 6.9* 7.7*  HCT 22.2* 21.1* 23.9*  PLT 287 262 312   BMET  Basename 01/28/12 0515 01/27/12 0505 01/26/12 0340  NA 140 140 134*  K 3.4* 3.8 3.8  CL 110 110 104  CO2 26 25 25   GLUCOSE 120* 100* 113*  BUN 12 24* 30*  CREATININE 1.18 1.11 1.12  CALCIUM 8.5 8.6 8.7   Studies/Results: No results found.  Medications: I have reviewed the patient's current medications.  Assessment/Plan: Melena with anemia: Will schedule an EGD in AM. If the EGD is normal, he may need a colonoscopy.  LOS: 4 days   Meda Dudzinski 01/28/2012, 7:03 PM

## 2012-01-29 ENCOUNTER — Encounter (HOSPITAL_COMMUNITY): Payer: Self-pay | Admitting: *Deleted

## 2012-01-29 ENCOUNTER — Encounter (HOSPITAL_COMMUNITY)
Admission: EM | Disposition: A | Discharge status: Skilled Nursing Facility | Payer: Self-pay | Source: Home / Self Care | Attending: Internal Medicine

## 2012-01-29 DIAGNOSIS — D62 Acute posthemorrhagic anemia: Secondary | ICD-10-CM

## 2012-01-29 DIAGNOSIS — J189 Pneumonia, unspecified organism: Secondary | ICD-10-CM

## 2012-01-29 DIAGNOSIS — K921 Melena: Secondary | ICD-10-CM

## 2012-01-29 DIAGNOSIS — K2961 Other gastritis with bleeding: Secondary | ICD-10-CM

## 2012-01-29 DIAGNOSIS — K922 Gastrointestinal hemorrhage, unspecified: Secondary | ICD-10-CM

## 2012-01-29 HISTORY — PX: ESOPHAGOGASTRODUODENOSCOPY: SHX5428

## 2012-01-29 LAB — TYPE AND SCREEN
ABO/RH(D): O POS
Antibody Screen: NEGATIVE

## 2012-01-29 LAB — BASIC METABOLIC PANEL
BUN: 7 mg/dL (ref 6–23)
Calcium: 8.7 mg/dL (ref 8.4–10.5)
GFR calc non Af Amer: 63 mL/min — ABNORMAL LOW (ref >90)
Glucose, Bld: 95 mg/dL (ref 70–99)
Potassium: 3.5 mEq/L (ref 3.5–5.1)

## 2012-01-29 LAB — GLUCOSE, CAPILLARY: Glucose-Capillary: 78 mg/dL (ref 70–99)

## 2012-01-29 LAB — CBC
Hemoglobin: 9.2 g/dL — ABNORMAL LOW (ref 13.0–17.0)
MCH: 29.6 pg (ref 26.0–34.0)
MCHC: 33.5 g/dL (ref 30.0–36.0)

## 2012-01-29 SURGERY — EGD (ESOPHAGOGASTRODUODENOSCOPY)
Anesthesia: Moderate Sedation

## 2012-01-29 MED ORDER — BUTAMBEN-TETRACAINE-BENZOCAINE 2-2-14 % EX AERO
INHALATION_SPRAY | CUTANEOUS | Status: DC | PRN
  Administered 2012-01-29: 2 via TOPICAL

## 2012-01-29 MED ORDER — SUCRALFATE 1 GM/10ML PO SUSP
1.0000 g | Freq: Four times a day (QID) | ORAL | Status: DC
  Administered 2012-01-29 – 2012-01-31 (×8): 1 g via ORAL
  Filled 2012-01-29 (×11): qty 10

## 2012-01-29 MED ORDER — FENTANYL CITRATE 0.05 MG/ML IJ SOLN
INTRAMUSCULAR | Status: AC
  Filled 2012-01-29: qty 2

## 2012-01-29 MED ORDER — MIDAZOLAM HCL 10 MG/2ML IJ SOLN
INTRAMUSCULAR | Status: AC
  Filled 2012-01-29: qty 2

## 2012-01-29 MED ORDER — FENTANYL NICU IV SYRINGE 50 MCG/ML
INJECTION | INTRAMUSCULAR | Status: DC | PRN
  Administered 2012-01-29 (×2): 25 ug via INTRAVENOUS

## 2012-01-29 MED ORDER — SODIUM CHLORIDE 0.9 % IV SOLN
INTRAVENOUS | Status: DC
  Administered 2012-01-29: 500 mL via INTRAVENOUS

## 2012-01-29 MED ORDER — MIDAZOLAM HCL 10 MG/2ML IJ SOLN
INTRAMUSCULAR | Status: DC | PRN
  Administered 2012-01-29: 2 mg via INTRAVENOUS
  Administered 2012-01-29: 1 mg via INTRAVENOUS
  Administered 2012-01-29: 2 mg via INTRAVENOUS

## 2012-01-29 NOTE — Clinical Documentation Improvement (Signed)
Anemia Documentation Clarification Query  THIS DOCUMENT IS NOT A PERMANENT PART OF THE MEDICAL RECORD  RESPOND TO THE THIS QUERY, FOLLOW THE INSTRUCTIONS BELOW:  1. If needed, update documentation for the patient's encounter via the notes activity.  2. Access this query again and click edit on the In [employer].  3. After updating, or not, click F2 to complete all highlighted (required) fields concerning your review. Select "additional documentation in the medical record" OR "no additional documentation provided".  4. Click Sign note button.  5. The deficiency will fall out of your In Basket *Please let us know if you are not able to complete this workflow by phone or e-mail (listed below).          01/29/12  Dear Dr. [facility]  In an effort to better capture your patient's severity of illness, reflect appropriate length of stay and utilization of resources, a review of the patient medical record has revealed the following indicators.    Based on your clinical judgment, please clarify and document in a progress note and/or discharge summary the clinical condition associated with the following supporting information:  In responding to this query please exercise your independent judgment.  The fact that a query is asked, does not imply that any particular answer is desired or expected.  01/28/12 Progr note..."..Anemia.Marland KitchenMarland KitchenMarked drop in the hemoglobin from 14.0 to 9.8 and then 7.7.-Felt initially to be hemodilution, but this is almost 7 g drop.".Marland KitchenMarland KitchenFor accurate Dx specificity & severity can noted Anemia be further specified. Thank you  Possible Clinical Conditions? Precipitous drop in Hematocrit Acute Blood Loss Anemia  Iron deficiency anemia Chronic blood loss anemia  Anemia due to chronic disease Aplastic anemia  Other Condition (please specify) Cannot Clinically Determine  Supporting Information: Signs and Symptoms: 01/28/12 Progr  note.Marland KitchenMarland Kitchen"Anemia -Marked drop  in the hemoglobin from 14.0 to 9.8 and then 7.7. -Felt initially to be hemodilution, but this is almost 7 g drop. -Will transfuse 2 units of blood. -Positive FOBT, GI following.".Marland KitchenMarland Kitchen  Diagnostics: see above note Other: EGD pending today 01/29/12  Treatments: see above note; 01/29/12 cont to monitor H/H  Transfusion: see above note    You may use possible, probable, or suspect with inpatient documentation. Possible, probable, suspected diagnoses MUST be documented at the time of discharge.  Reviewed:  no additional documentation provided  Thank You,  Toribio Harbour, RN, BSN, CCDS Certified Clinical Documentation Specialist Pager: [phone]  Health Information Management [place]

## 2012-01-29 NOTE — Progress Notes (Signed)
DAILY PROGRESS NOTE                              GENERAL [facility]  SUBJECTIVE: Denies any complaints, no bloody bowel movements or hematemesis.  OBJECTIVE: BP 123/68  Pulse 66  Temp(Src) 97.4 F (36.3 C) (Oral)  Resp 18  Ht 6' (1.829 m)  Wt 94.5 kg (208 lb 5.4 oz)  BMI 28.26 kg/m2  SpO2 96%  Intake/Output Summary (Last 24 hours) at 01/29/12 1137 Last data filed at 01/29/12 0900  Gross per 24 hour  Intake 3016.66 ml  Output   2300 ml  Net 716.66 ml                      Weight change:  Physical Exam: General: Alert and awake oriented x3 not in any acute distress. HEENT: anicteric sclera, pupils equal reactive to light and accommodation CVS: S1-S2 heard, no murmur rubs or gallops Chest: clear to auscultation bilaterally, no wheezing rales or rhonchi Abdomen:  normal bowel sounds, soft, nontender, nondistended, no organomegaly Neuro: Cranial nerves II-XII intact, no focal neurological deficits Extremities: no cyanosis, no clubbing or edema noted bilaterally   Lab Results:  [facility] 01/29/12 0449 01/28/12 0515  NA 137 140  K 3.5 3.4*  CL 106 110  CO2 25 26  GLUCOSE 95 120*  BUN 7 12  CREATININE 1.15 1.18  CALCIUM 8.7 8.5  MG -- --  PHOS -- --    Basename 01/29/12 0449 01/28/12 1940 01/28/12 0616  WBC 10.2 9.3 --  NEUTROABS -- -- 4.5  HGB 9.2* 10.0* --  HCT 27.5* 29.7* --  MCV 88.4 88.9 --  PLT 272 266 --   No results found for this basename: CKTOTAL:3,CKMB:3,CKMBINDEX:3,TROPONINI:3 in the last 72 hours Micro Results: Recent Results (from the past 240 hour(s))  CULTURE, BLOOD (ROUTINE X 2)     Status: Normal   Collection Time   01/25/12  1:13 AM      Component Value Range Status Comment   Specimen Description BLOOD RIGHT WRIST   Final    Special Requests BOTTLES DRAWN AEROBIC AND ANAEROBIC 5CC   Final    Culture  Setup Time [id number]   Final    Culture     Final    Value: STAPHYLOCOCCUS SPECIES (COAGULASE NEGATIVE)     Note:  THE SIGNIFICANCE OF ISOLATING THIS ORGANISM FROM Isaiah [name] SINGLE SET OF BLOOD CULTURES WHEN MULTIPLE SETS ARE DRAWN IS UNCERTAIN. PLEASE NOTIFY THE MICROBIOLOGY DEPARTMENT WITHIN ONE WEEK IF SPECIATION AND SENSITIVITIES ARE REQUIRED.     Note: Gram Stain Report Called to,Read Back By and Verified With:  BROOKE WHITE @0324  ON 01/26/2012 BY MCLET   Report Status 01/27/2012 FINAL   Final   CULTURE, BLOOD (ROUTINE X 2)     Status: Normal (Preliminary result)   Collection Time   01/25/12  8:50 AM      Component Value Range Status Comment   Specimen Description BLOOD RIGHT ARM   Final    Special Requests BOTTLES DRAWN AEROBIC AND ANAEROBIC 4CC   Final    Culture  Setup Time [id number]   Final    Culture     Final    Value:        BLOOD CULTURE RECEIVED NO GROWTH TO DATE CULTURE WILL BE HELD FOR 5 DAYS BEFORE ISSUING Isaiah [name] FINAL NEGATIVE REPORT   Report Status PENDING   Incomplete  URINE CULTURE     Status: Normal   Collection Time   01/25/12  8:53 AM      Component Value Range Status Comment   Specimen Description URINE, CATHETERIZED   Final    Special Requests NONE   Final    Culture  Setup Time [id number]   Final    Colony Count NO GROWTH   Final    Culture NO GROWTH   Final    Report Status 01/26/2012 FINAL   Final   MRSA PCR SCREENING     Status: Normal   Collection Time   01/25/12  6:03 PM      Component Value Range Status Comment   MRSA by PCR NEGATIVE  NEGATIVE  Final     Studies/Results: Dg Chest Port 1 View  01/25/2012  *RADIOLOGY REPORT*  Clinical Data: Hypertension  PORTABLE CHEST - 1 VIEW  Comparison: 06/15/2009  Findings: There is Isaiah [name] tortuous and unfolded thoracic aorta.  Lung volumes are low.  There is an opacity within the right upper lobe and left base suspicious for infiltrate.  IMPRESSION:  1.  Low lung volumes. 2.  Bilateral infiltrates.  Original Report Authenticated By: Rosealee Albee, M.D.   Dg Abd Acute W/chest  01/12/2012  *RADIOLOGY REPORT*  Clinical Data: 71 year old  male with abdominal pain and distention.  ACUTE ABDOMEN SERIES (ABDOMEN 2 VIEW & CHEST 1 VIEW)  Comparison: 06/09/2004 CT  Findings: Elevated left hemidiaphragm is again noted with left basilar atelectasis/scarring. Upper limits normal heart size again identified. No definite focal airspace disease, pleural effusion or pneumothorax noted.  Mildly distended gas and fluid filled loops of small bowel, stomach and colon are noted. Stool in the rectum is identified. There is no evidence of pneumoperitoneum. No suspicious calcifications are present. No acute bony abnormalities are identified.  IMPRESSION: Nonspecific bowel gas pattern with mildly distended gas and fluid filled stomach, small bowel and colon.  This most likely represents Isaiah [name] gastroenteritis.  No evidence of pneumoperitoneum.  No evidence of acute cardiopulmonary disease.  Original Report Authenticated By: [name], M.D.   Medications: Scheduled Meds:    . docusate sodium  100 mg Oral BID  . DULoxetine  60 mg Oral Daily  . fenofibrate  54 mg Oral Daily  . pantoprazole (PROTONIX) IV  40 mg Intravenous Q12H  . piperacillin-tazobactam (ZOSYN)  IV  3.375 g Intravenous Q8H  . senna  1 tablet Oral BID  . simvastatin  10 mg Oral QHS  . sodium chloride  3 mL Intravenous Q12H  . vancomycin  1,250 mg Intravenous Q12H   Continuous Infusions:    . dextrose 5 % and 0.9% NaCl 125 mL/hr at 01/28/12 1957   PRN Meds:.acetaminophen, acetaminophen, ondansetron (ZOFRAN) IV, ondansetron  ASSESSMENT & PLAN: Principal Problem:  *Sepsis Active Problems:  CEREBROVASCULAR ACCIDENT WITH LEFT HEMIPARESIS  Hypotension  Healthcare-associated pneumonia  Hyponatremia  CKD (chronic kidney disease) stage 2, GFR 60-89 ml/min  Leukocytosis   Pneumonia -Healthcare associated pneumonia, seems bibasilar. -Patient is on Zosyn and vancomycin. Very minimal oxygen requirements.  Sepsis -Likely secondary to the pneumonia. -1/2 of blood cultures positive for  coagulase negative staph, I think its contaminant. -Hypotension, resolved. Vitals are stable.  Anemia -Marked drop in the hemoglobin from 14.0 to 9.8 and then 7.7. -Felt initially to be hemodilution, but this is almost 7 g drop. -Status post transfusion of 2 units of blood, hemoglobin stable now. -EGD to be done by gastroenterology today, if negative likely to have  colonoscopy.  CKD -Mild chronic kidney disease, likely stage 2-3 -Creatinine at its baseline 1.1.  History of CVA -Residual left-sided weakness  Dysphagia -Secondary to the previous CVA. Patient failed swallow screen in the emergency department. -Dysphagia 3 with Isaiah [name] thin liquids  Disposition -Remains as inpatient, telemetry bed.   LOS: 5 days   Isaiah Isaiah [name] 01/29/2012, 11:37 AM

## 2012-01-29 NOTE — Progress Notes (Signed)
ANTIBIOTIC CONSULT NOTE - FOLLOW UP  Pharmacy Consult for Vanc, Zosyn Indication: HCAP  No Known Allergies  Patient Measurements: Height: 6' (182.9 cm) Weight: 208 lb 5.4 oz (94.5 kg) IBW/kg (Calculated) : 77.6   Vital Signs: Temp: 97.4 F (36.3 C) (03/25 0454) Temp src: Oral (03/25 0454) BP: 123/68 mmHg (03/25 0454) Pulse Rate: 66  (03/25 0454) Intake/Output from previous day: 03/24 0701 - 03/25 0700 In: 1610.9 [P.O.:400; I.V.:3133.3; Blood:1062.9; IV Piggyback:100] Out: 3000 [Urine:3000] Intake/Output from this shift:    Labs:  Basename 01/29/12 0449 01/28/12 1940 01/28/12 0616 01/28/12 0515 01/27/12 0505  WBC 10.2 9.3 8.0 -- --  HGB 9.2* 10.0* 7.3* -- --  PLT 272 266 287 -- --  LABCREA -- -- -- -- --  CREATININE 1.15 -- -- 1.18 1.11   Estimated Creatinine Clearance: 71.4 ml/min (by C-G formula based on Cr of 1.15).   Assessment:  71 yo male NH resident on Day #5 of Vancomycin and Zosyn for presumed HCAP.    Afebrile, WBC wnl, Scr stable  Blood cx - 1/2 CNS - most likely contaminant.  Negative urine cx.  Last CXR 3/21 with bilateral infiltrates   Goal of Therapy:  Vancomycin trough level 15-20 mcg/ml  Plan:   Please consider narrowing antibiotics   Pharmacy will need to check trough if plan to continue vancomycin.  Geoffry Paradise Thi 01/29/2012,10:10 AM

## 2012-01-29 NOTE — Op Note (Signed)
[facility] [address] [place], [place]  [address]  ENDOSCOPY PROCEDURE REPORT  PATIENT:  Isaiah [name], Isaiah [name]  MR#:  [id number] BIRTHDATE:  [birth year], 70 yrs. old  GENDER:  male  ENDOSCOPIST:  Hedwig Morton. Juanda Chance, MD Referred by:  Baltazar Najjar, M.D.  PROCEDURE DATE:  01/29/2012 PROCEDURE:  EGD with biopsy, 43239 ASA CLASS:  Class III INDICATIONS:  hemorrhage of GI tract, Botox injection for achalasia  MEDICATIONS:   These medications were titrated to patient response per physician's verbal order, Versed 5 mg, Fentanyl 50 mcg TOPICAL ANESTHETIC:  Cetacaine Spray  DESCRIPTION OF PROCEDURE:   After the risks benefits and alternatives of the procedure were thoroughly explained, informed consent was obtained.  The Pentax Gastroscope [id number] endoscope was introduced through the mouth and advanced to the second portion of the duodenum, without limitations.  The instrument was slowly withdrawn as the mucosa was fully examined. <<PROCEDUREIMAGES>>  Moderate gastritis was found. larfe area of hemorrhagic mucosa at the proximal stomach which appears to be displaced horizontally, so that when he coughs, this part of the stomach is prolapsing upward causing Cameron-type erosions With standard forceps, a biopsy was obtained and sent to pathology (see image1, image2, image3, image8, image9, and image5).  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 severe angulation of distal esophagus creating resistance when scope passed, no true stricture  Otherwise the examination was normal (see image6 and image7). due to horizontal configuration of the stomach we ran out of upper endoscope and had to use pediatric colonoscope to rech the pylorus and normal duodenum    Retroflexed views revealed no abnormalities.    The scope was then withdrawn from the patient and the procedure completed.  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 consider Barium study to better visualize gastric configuration  advance diet  REPEAT EXAM:  In 0 year(s) for.  ______________________________ Hedwig Morton. Juanda Chance, MD  CC:  n. eSIGNED:   Hedwig Morton. Andjela Wickes at 01/29/2012 02:11 PM  Westley Hummer, [phone]

## 2012-01-29 NOTE — Progress Notes (Signed)
Patient ID: Isaiah [name], male   DOB: 05/04/1941, 71 y.o.   MRN: [id number]  Gastroenterology Progress Note  Subjective: No further Melena last night or this am, he says he feels "ok", No c/o abdominal discomfort or nausea.  Objective:  Vital signs in last 24 hours: Temp:  [97.4 F (36.3 C)-98.4 F (36.9 C)] 97.4 F (36.3 C) (03/25 0454) Pulse Rate:  [62-74] 66  (03/25 0454) Resp:  [18-20] 18  (03/25 0454) BP: (103-128)/(68-72) 123/68 mmHg (03/25 0454) SpO2:  [96 %-98 %] 96 % (03/25 0454) Last BM Date: 01/26/12 General:   Alert,  Well-developed,    in NAD,left hemiparesis Heart:  Regular rate and rhythm; no murmurs Pulm;clear ant Abdomen:  Soft, nontender and nondistended. Normal bowel sounds, without guarding,    Neurologic:  Alert and  oriented x3  Left hemiparesis, mild dysarthria Intake/Output from previous day: 03/24 0701 - 03/25 0700 In: 7829.5 [P.O.:400; I.V.:3133.3; Blood:1062.9; IV Piggyback:100] Out: 3000 [Urine:3000] Intake/Output this shift:    Lab Results:  Basename 01/29/12 0449 01/28/12 1940 01/28/12 0616  WBC 10.2 9.3 8.0  HGB 9.2* 10.0* 7.3*  HCT 27.5* 29.7* 22.2*  PLT 272 266 287   BMET  Basename 01/29/12 0449 01/28/12 0515 01/27/12 0505  NA 137 140 140  K 3.5 3.4* 3.8  CL 106 110 110  CO2 25 26 25   GLUCOSE 95 120* 100*  BUN 7 12 24*  CREATININE 1.15 1.18 1.11  CALCIUM 8.7 8.5 8.6     PT/INR No results found for this basename: LABPROT:2,INR:2 in the last 72 hours   Assessment / Plan: #1 71 yo male s/p CVA- with melena while on heparin( d/c'd).  R/o PUD or gastric lesion. For EGD this am. #2 Anemia- secondary to acute blood loss, stable #3 Pneumonia- on Zosyn #4 Hx of multiple colon polyps, nodular rectal mucosa on colonoscopy 2011 Principal Problem:  *Sepsis Active Problems:  CEREBROVASCULAR ACCIDENT WITH LEFT HEMIPARESIS  Hypotension  Healthcare-associated pneumonia  Hyponatremia  CKD (chronic kidney disease) stage 2, GFR  60-89 ml/min  Leukocytosis     LOS: 5 days   Amy Esterwood  01/29/2012, 9:46 AM    .I have reviewed the above note, examined the patient and agree with plan of treatment.

## 2012-01-29 NOTE — Progress Notes (Signed)
ST Cancellation Note  _X__ Treatment cancelled today due to patient receiving procedure or test    Signature:  Donavan Burnet, MS [facility] SLP         [phone]

## 2012-01-30 ENCOUNTER — Encounter: Payer: Self-pay | Admitting: Internal Medicine

## 2012-01-30 ENCOUNTER — Encounter (HOSPITAL_COMMUNITY): Payer: Self-pay | Admitting: Internal Medicine

## 2012-01-30 DIAGNOSIS — K219 Gastro-esophageal reflux disease without esophagitis: Secondary | ICD-10-CM

## 2012-01-30 LAB — BASIC METABOLIC PANEL
BUN: 5 mg/dL — ABNORMAL LOW (ref 6–23)
Calcium: 8.9 mg/dL (ref 8.4–10.5)
GFR calc non Af Amer: 57 mL/min — ABNORMAL LOW (ref >90)
Glucose, Bld: 115 mg/dL — ABNORMAL HIGH (ref 70–99)
Sodium: 137 mEq/L (ref 135–145)

## 2012-01-30 LAB — CBC
Hemoglobin: 9.5 g/dL — ABNORMAL LOW (ref 13.0–17.0)
MCH: 29.2 pg (ref 26.0–34.0)
MCHC: 32.2 g/dL (ref 30.0–36.0)
RDW: 15.8 % — ABNORMAL HIGH (ref 11.5–15.5)

## 2012-01-30 MED ORDER — PANTOPRAZOLE SODIUM 40 MG PO TBEC
40.0000 mg | DELAYED_RELEASE_TABLET | Freq: Two times a day (BID) | ORAL | Status: DC
  Administered 2012-01-30 – 2012-01-31 (×2): 40 mg via ORAL
  Filled 2012-01-30 (×3): qty 1

## 2012-01-30 NOTE — Progress Notes (Signed)
Speech Language Pathology Dysphagia Treatment  Patient Details Name: Isaiah [name] MRN: [id number] DOB: 07-30-1941 Today's Date: 01/30/2012 [id number] SLP Assessment/Plan/Recommendation Assessment / Recommendations / Plan Clinical Impression Statement: Note results of EGD and GI following pt.  Pt provided with set up assistance for him to brush his teeth, encouraged pt to continue mouth care.  Suspect pt's primary issues with dysphagia are d/t GI issues.  SLP provided pt with compensatory strategies to maximize airway protection.  Pt reports coughing all night long, productive at times.  Pt did not recall conversation with slp last week regarding water consumption.  Pt reports poor appetite since vomiting episodes prior to admission, but denies vomiting during admit.  Rec continue diet with full supervision due to pt tendency to be impulsive and known multfactorial dysphagia.   No further slp indicated at this point.     Plan: Discharge SLP treatment due to (comment) (pt met goals) Swallowing Goals  SLP Swallowing Goals Swallow Study Goal #2 - Progress: Met 75% of meal consumed Pt is afebrile Lungs clear - nonproductive cough  General Temperature Spikes Noted: Yes Respiratory Status: Supplemental O2 delivered via (comment) Behavior/Cognition: Alert;Cooperative;Pleasant mood (flat affect) Oral Cavity - Dentition: Poor condition Patient Positioning: Upright in bed  Oral Cavity - Oral Hygiene   Pt brushed his own teeth with set up.  Please continue oral care daily.   Dysphagia Treatment Treatment focused on: Patient/family/caregiver education;Skilled observation of diet tolerance;Utilization of compensatory strategies Family/Caregiver Educated: pt Treatment Methods/Modalities: Skilled observation Patient observed directly with PO's: Yes Type of PO's observed: Dysphagia 1 (puree);Thin liquids Feeding: Able to feed self;Other (Comment) (with set up) Liquids provided via: Cup;No straw (pt  prefers not to use straw due to incr coughing with straw) Pharyngeal Phase Signs & Symptoms: Multiple swallows;Delayed cough (pt reports coughing all night long)   Chales Abrahams 01/30/2012, 10:37 AM

## 2012-01-30 NOTE — Progress Notes (Signed)
Patient ID: Isaiah [name], male   DOB: 06/25/1941, 71 y.o.   MRN: [id number] [facility] Progress Note  Subjective: Feels OK, did not eat breakfast because he says he can't eat lying down.. No n/v.  Objective:  Vital signs in last 24 hours: Temp:  [97.5 F (36.4 C)-98.6 F (37 C)] 97.5 F (36.4 C) (03/26 0453) Pulse Rate:  [63-68] 66  (03/26 0453) Resp:  [13-21] 16  (03/26 0453) BP: (108-148)/(55-74) 148/74 mmHg (03/26 0453) SpO2:  [96 %-100 %] 97 % (03/26 0453) Last BM Date: 01/28/12 General:   Alert,  Well-developed,    in NAD Heart:  Regular rate and rhythm; no murmurs Pulm;clear ant Abdomen:  Soft, nontender and nondistended. Normal bowel sounds, Extremities:  Without edema. Neurologic:  Alert and  oriented x4; left hemiparesis Psych:  Alert and cooperative. Normal mood and affect.  Intake/Output from previous day: 03/25 0701 - 03/26 0700 In: 5606.3 [P.O.:120; I.V.:4736.3; IV Piggyback:750] Out: 2600 [Urine:2600] Intake/Output this shift:    Lab Results:  Basename 01/30/12 0514 01/29/12 0449 01/28/12 1940  WBC 8.0 10.2 9.3  HGB 9.5* 9.2* 10.0*  HCT 29.5* 27.5* 29.7*  PLT 286 272 266   BMET  Basename 01/30/12 0514 01/29/12 0449 01/28/12 0515  NA 137 137 140  K 3.5 3.5 3.4*  CL 106 106 110  CO2 26 25 26   GLUCOSE 115* 95 120*  BUN 5* 7 12  CREATININE 1.25 1.15 1.18  CALCIUM 8.9 8.7 8.5   Assessment / Plan: #1 71 yo male s/p CVA with left hemiparesis with pneumonia #2 melena- resolved- gastritis on EGD likely secondary to unusual configuration of staomach/friction.  continue bid PPI Continue carafate 4 x daily Advance diet  #3 anemia- stable #4 CKD  Ge t pt up to chair for meals  We will be available for problems, thanks  Principal Problem:  *Sepsis Active Problems:  CEREBROVASCULAR ACCIDENT WITH LEFT HEMIPARESIS  Hypotension  Healthcare-associated pneumonia  Hyponatremia  CKD (chronic kidney disease) stage 2, GFR 60-89 ml/min  Leukocytosis  Hemorrhage of gastrointestinal tract, unspecified  Acute posthemorrhagic anemia  Other specified gastritis with hemorrhage     LOS: 6 days   Siddharth Babington  01/30/2012, 9:33 AM

## 2012-01-30 NOTE — Progress Notes (Signed)
DAILY PROGRESS NOTE                              GENERAL [facility]NAL [facility]  SUBJECTIVE: Denies bloody bowel movements or hematemesis.  OBJECTIVE: BP 148/74  Pulse 66  Temp(Src) 97.5 F (36.4 C) (Oral)  Resp 16  Ht 6' (1.829 m)  Wt 94.5 kg (208 lb 5.4 oz)  BMI 28.26 kg/m2  SpO2 97%  Intake/Output Summary (Last 24 hours) at 01/30/12 1204 Last data filed at 01/30/12 0600  Gross per 24 hour  Intake 5606.25 ml  Output   2600 ml  Net 3006.25 ml                      Weight change:  Physical Exam: General: Alert and awake oriented x3 not in any acute distress. HEENT: anicteric sclera, pupils equal reactive to light and accommodation CVS: S1-S2 heard, no murmur rubs or gallops Chest: clear to auscultation bilaterally, no wheezing rales or rhonchi Abdomen:  normal bowel sounds, soft, nontender, nondistended, no organomegaly Neuro: Cranial nerves II-XII intact, no focal neurological deficits Extremities: no cyanosis, no clubbing or edema noted bilaterally   Lab Results:  [facility] 01/30/12 0514 01/29/12 0449  NA 137 137  K 3.5 3.5  CL 106 106  CO2 26 25  GLUCOSE 115* 95  BUN 5* 7  CREATININE 1.25 1.15  CALCIUM 8.9 8.7  MG -- --  PHOS -- --    Basename 01/30/12 0514 01/29/12 0449 01/28/12 0616  WBC 8.0 10.2 --  NEUTROABS -- -- 4.5  HGB 9.5* 9.2* --  HCT 29.5* 27.5* --  MCV 90.8 88.4 --  PLT 286 272 --   No results found for this basename: CKTOTAL:3,CKMB:3,CKMBINDEX:3,TROPONINI:3 in the last 72 hours Micro Results: Recent Results (from the past 240 hour(s))  CULTURE, BLOOD (ROUTINE X 2)     Status: Normal   Collection Time   01/25/12  1:13 AM      Component Value Range Status Comment   Specimen Description BLOOD RIGHT WRIST   Final    Special Requests BOTTLES DRAWN AEROBIC AND ANAEROBIC 5CC   Final    Culture  Setup Time [id number]   Final    Culture     Final    Value: STAPHYLOCOCCUS SPECIES (COAGULASE NEGATIVE)     Note: THE SIGNIFICANCE OF  ISOLATING THIS ORGANISM FROM A SINGLE SET OF BLOOD CULTURES WHEN MULTIPLE SETS ARE DRAWN IS UNCERTAIN. PLEASE NOTIFY THE MICROBIOLOGY DEPARTMENT WITHIN ONE WEEK IF SPECIATION AND SENSITIVITIES ARE REQUIRED.     Note: Gram Stain Report Called to,Read Back By and Verified With:  BROOKE WHITE @0324  ON 01/26/2012 BY MCLET   Report Status 01/27/2012 FINAL   Final   CULTURE, BLOOD (ROUTINE X 2)     Status: Normal (Preliminary result)   Collection Time   01/25/12  8:50 AM      Component Value Range Status Comment   Specimen Description BLOOD RIGHT ARM   Final    Special Requests BOTTLES DRAWN AEROBIC AND ANAEROBIC 4CC   Final    Culture  Setup Time [id number]   Final    Culture     Final    Value:        BLOOD CULTURE RECEIVED NO GROWTH TO DATE CULTURE WILL BE HELD FOR 5 DAYS BEFORE ISSUING A FINAL NEGATIVE REPORT   Report Status PENDING   Incomplete   URINE CULTURE  Status: Normal   Collection Time   01/25/12  8:53 AM      Component Value Range Status Comment   Specimen Description URINE, CATHETERIZED   Final    Special Requests NONE   Final    Culture  Setup Time [id number]   Final    Colony Count NO GROWTH   Final    Culture NO GROWTH   Final    Report Status 01/26/2012 FINAL   Final   MRSA PCR SCREENING     Status: Normal   Collection Time   01/25/12  6:03 PM      Component Value Range Status Comment   MRSA by PCR NEGATIVE  NEGATIVE  Final     Studies/Results: Dg Chest Port 1 View  01/25/2012  *RADIOLOGY REPORT*  Clinical Data: Hypertension  PORTABLE CHEST - 1 VIEW  Comparison: 06/15/2009  Findings: There is a tortuous and unfolded thoracic aorta.  Lung volumes are low.  There is an opacity within the right upper lobe and left base suspicious for infiltrate.  IMPRESSION:  1.  Low lung volumes. 2.  Bilateral infiltrates.  Original Report Authenticated By: Rosealee Albee, M.D.   Dg Abd Acute W/chest  01/12/2012  *RADIOLOGY REPORT*  Clinical Data: 71 year old male with abdominal  pain and distention.  ACUTE ABDOMEN SERIES (ABDOMEN 2 VIEW & CHEST 1 VIEW)  Comparison: 06/09/2004 CT  Findings: Elevated left hemidiaphragm is again noted with left basilar atelectasis/scarring. Upper limits normal heart size again identified. No definite focal airspace disease, pleural effusion or pneumothorax noted.  Mildly distended gas and fluid filled loops of small bowel, stomach and colon are noted. Stool in the rectum is identified. There is no evidence of pneumoperitoneum. No suspicious calcifications are present. No acute bony abnormalities are identified.  IMPRESSION: Nonspecific bowel gas pattern with mildly distended gas and fluid filled stomach, small bowel and colon.  This most likely represents a gastroenteritis.  No evidence of pneumoperitoneum.  No evidence of acute cardiopulmonary disease.  Original Report Authenticated By: [name], M.D.   Medications: Scheduled Meds:    . docusate sodium  100 mg Oral BID  . DULoxetine  60 mg Oral Daily  . fenofibrate  54 mg Oral Daily  . pantoprazole (PROTONIX) IV  40 mg Intravenous Q12H  . piperacillin-tazobactam (ZOSYN)  IV  3.375 g Intravenous Q8H  . senna  1 tablet Oral BID  . simvastatin  10 mg Oral QHS  . sodium chloride  3 mL Intravenous Q12H  . sucralfate  1 g Oral Q6H  . vancomycin  1,250 mg Intravenous Q12H   Continuous Infusions:    . sodium chloride 500 mL (01/29/12 1300)  . dextrose 5 % and 0.9% NaCl 125 mL/hr at 01/29/12 1757   PRN Meds:.acetaminophen, acetaminophen, ondansetron (ZOFRAN) IV, ondansetron, DISCONTD: butamben-tetracaine-benzocaine, DISCONTD: fentaNYL, DISCONTD: midazolam  ASSESSMENT & PLAN: Principal Problem:  *Sepsis Active Problems:  CEREBROVASCULAR ACCIDENT WITH LEFT HEMIPARESIS  Hypotension  Healthcare-associated pneumonia  Hyponatremia  CKD (chronic kidney disease) stage 2, GFR 60-89 ml/min  Leukocytosis  Hemorrhage of gastrointestinal tract, unspecified  Acute posthemorrhagic anemia   Other specified gastritis with hemorrhage   71 year old male with past medical history of 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 Brought to the hospital from his facility for hypotension and leukocytosis. Chest x-ray showed bilateral basilar pneumonia, patient started on IV antibiotics at the time of admission. He also had a drop in his hemoglobin with positive Hemoccult. Gastroenterology was consulted EGD was done on  01/29/2012 showed gastritis. The plan is to advance diet, he will be done with antibiotic tomorrow and probably will be okay to go back to SNF if okay with GI.  Pneumonia -Healthcare associated pneumonia, seems bibasilar. -Patient is on Zosyn and vancomycin. Very minimal oxygen requirements. -Patient will finish days of IV antibiotics in the morning.  Sepsis -Likely secondary to the pneumonia. -1/2 of blood cultures positive for coagulase negative staph, I think its contaminant. -Hypotension, resolved. Vitals are stable.  Anemia -Marked drop in the hemoglobin from 14.0 to 9.8 and then 7.7. Currently stable. -Will switch the PPI to oral Protonix -Status post transfusion of 2 units of blood, hemoglobin stable now. 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 The stomach is horizontally displaced we'll get barium study to visualize the gastric configuration.  CKD -Mild chronic kidney disease, likely stage 2-3 -Creatinine at its baseline 1.1.  History of CVA -Residual left-sided weakness  Dysphagia -Secondary to the previous CVA. Patient failed swallow screen in the emergency department. -Dysphagia 3 with a thin liquids  Disposition -Remains as inpatient. -Likely back to the nursing home in the morning.   LOS: 6 days   Blia Totman A 01/30/2012, 12:04 PM

## 2012-01-31 LAB — CULTURE, BLOOD (ROUTINE X 2): Culture: NO GROWTH

## 2012-01-31 LAB — BASIC METABOLIC PANEL
BUN: 5 mg/dL — ABNORMAL LOW (ref 6–23)
Calcium: 9.2 mg/dL (ref 8.4–10.5)
GFR calc non Af Amer: 57 mL/min — ABNORMAL LOW (ref >90)
Glucose, Bld: 98 mg/dL (ref 70–99)
Sodium: 139 mEq/L (ref 135–145)

## 2012-01-31 LAB — CBC
MCH: 29.2 pg (ref 26.0–34.0)
MCHC: 32.5 g/dL (ref 30.0–36.0)
Platelets: 289 10*3/uL (ref 150–400)

## 2012-01-31 LAB — GLUCOSE, CAPILLARY: Glucose-Capillary: 82 mg/dL (ref 70–99)

## 2012-01-31 MED ORDER — PANTOPRAZOLE SODIUM 40 MG PO TBEC: 40.0000 mg | DELAYED_RELEASE_TABLET | Freq: Two times a day (BID) | ORAL | Status: DC

## 2012-01-31 MED ORDER — LEVOFLOXACIN 500 MG PO TABS: 500.0000 mg | ORAL_TABLET | Freq: Every day | ORAL | Status: AC

## 2012-01-31 NOTE — Progress Notes (Signed)
I have reviewed the above note, examined the patient and agree with plan of treatment. Will sign off. Stable post GIB

## 2012-01-31 NOTE — Discharge Summary (Signed)
Physician Discharge Summary  Isaiah [name] MRN: [id number] DOB/AGE: 1940-11-21 71 y.o.  PCP: Karlene Einstein, MD, MD   Admit date: 01/24/2012 Discharge date: 01/31/2012  Discharge Diagnoses:   :  *Sepsis due to healthcare associated pneumonia Active Problems:  CEREBROVASCULAR ACCIDENT WITH LEFT HEMIPARESIS  Hypotension  Healthcare-associated pneumonia  Hyponatremia  CKD (chronic kidney disease) stage 2, GFR 60-89 ml/min  Leukocytosis  Hemorrhage of gastrointestinal tract, unspecified  Acute posthemorrhagic anemia  Other specified gastritis with hemorrhage   Medication List  As of 01/31/2012 11:27 AM   STOP taking these medications         omeprazole 20 MG capsule         TAKE these medications         amLODipine 5 MG tablet   Commonly known as: NORVASC   Take 5 mg by mouth daily.             CERTAGEN PO   Take 1 tablet by mouth daily.      DULoxetine 60 MG capsule   Commonly known as: CYMBALTA   Take 60 mg by mouth daily.      fenofibrate 54 MG tablet   Take 54 mg by mouth daily.      folic acid 1 MG tablet   Commonly known as: FOLVITE   Take 1 mg by mouth daily.      levofloxacin 500 MG tablet   Commonly known as: LEVAQUIN   Take 1 tablet (500 mg total) by mouth daily.      pantoprazole 40 MG tablet   Commonly known as: PROTONIX   Take 1 tablet (40 mg total) by mouth 2 (two) times daily before a meal.      simvastatin 10 MG tablet   Commonly known as: ZOCOR   Take 10 mg by mouth at bedtime.            Discharge Condition: Stable Disposition: 01-Home or Self Care   Consults: GI Dr. Dickie La   Significant Diagnostic Studies: Dg Chest Port 1 View  01/25/2012  *RADIOLOGY REPORT*  Clinical Data: Hypertension  PORTABLE CHEST - 1 VIEW  Comparison: 06/15/2009  Findings: There is a tortuous and unfolded thoracic aorta.  Lung volumes are low.  There is an opacity within the right upper lobe and left base suspicious for infiltrate.   IMPRESSION:  1.  Low lung volumes. 2.  Bilateral infiltrates.  Original Report Authenticated By: Rosealee Albee, M.D.   Dg Abd Acute W/chest  01/12/2012  *RADIOLOGY REPORT*  Clinical Data: 71 year old male with abdominal pain and distention.  ACUTE ABDOMEN SERIES (ABDOMEN 2 VIEW & CHEST 1 VIEW)  Comparison: 06/09/2004 CT  Findings: Elevated left hemidiaphragm is again noted with left basilar atelectasis/scarring. Upper limits normal heart size again identified. No definite focal airspace disease, pleural effusion or pneumothorax noted.  Mildly distended gas and fluid filled loops of small bowel, stomach and colon are noted. Stool in the rectum is identified. There is no evidence of pneumoperitoneum. No suspicious calcifications are present. No acute bony abnormalities are identified.  IMPRESSION: Nonspecific bowel gas pattern with mildly distended gas and fluid filled stomach, small bowel and colon.  This most likely represents a gastroenteritis.  No evidence of pneumoperitoneum.  No evidence of acute cardiopulmonary disease.  Original Report Authenticated By: [name], M.D.   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  consider Barium study to better visualize gastric configuration     Microbiology: Recent Results (from the past 240 hour(s))  CULTURE, BLOOD (ROUTINE X 2)     Status: Normal   Collection Time   01/25/12  1:13 AM      Component Value Range Status Comment   Specimen Description BLOOD RIGHT WRIST   Final    Special Requests BOTTLES DRAWN AEROBIC AND ANAEROBIC 5CC   Final    Culture  Setup Time  [id number]   Final    Culture     Final    Value: STAPHYLOCOCCUS SPECIES (COAGULASE NEGATIVE)     Note: THE SIGNIFICANCE OF ISOLATING THIS ORGANISM FROM A SINGLE SET OF BLOOD CULTURES WHEN MULTIPLE SETS ARE DRAWN IS UNCERTAIN. PLEASE NOTIFY THE MICROBIOLOGY DEPARTMENT WITHIN ONE WEEK IF SPECIATION AND SENSITIVITIES ARE REQUIRED.     Note: Gram Stain Report Called to,Read Back By and Verified With:  BROOKE WHITE @0324  ON 01/26/2012 BY MCLET   Report Status 01/27/2012 FINAL   Final   CULTURE, BLOOD (ROUTINE X 2)     Status: Normal   Collection Time   01/25/12  8:50 AM      Component Value Range Status Comment   Specimen Description BLOOD RIGHT ARM   Final    Special Requests BOTTLES DRAWN AEROBIC AND ANAEROBIC 4CC   Final    Culture  Setup Time [id number]   Final    Culture NO GROWTH 5 DAYS   Final    Report Status 01/31/2012 FINAL   Final   URINE CULTURE     Status: Normal   Collection Time   01/25/12  8:53 AM      Component Value Range Status Comment   Specimen Description URINE, CATHETERIZED   Final    Special Requests NONE   Final    Culture  Setup Time [id number]   Final    Colony Count NO GROWTH   Final    Culture NO GROWTH   Final    Report Status 01/26/2012 FINAL   Final   MRSA PCR SCREENING     Status: Normal   Collection Time   01/25/12  6:03 PM      Component Value Range Status Comment   MRSA by PCR NEGATIVE  NEGATIVE  Final      Labs: Results for orders placed during the hospital encounter of 01/24/12 (from the past 48 hour(s))  GLUCOSE, CAPILLARY     Status: Normal   Collection Time   01/29/12  3:30 PM      Component Value Range Comment   Glucose-Capillary 78  70 - 99 (mg/dL)    Comment 1 Repeat Test     CBC     Status: Abnormal   Collection Time   01/30/12  5:14 AM      Component Value Range Comment   WBC 8.0  4.0 - 10.5 (K/uL)    RBC 3.25 (*) 4.22 - 5.81 (MIL/uL)    Hemoglobin 9.5 (*) 13.0 - 17.0 (g/dL)    HCT 24.4 (*) 01.0 - 52.0 (%)    MCV 90.8  78.0 -  100.0 (fL)    MCH 29.2  26.0 - 34.0 (pg)    MCHC 32.2  30.0 - 36.0 (g/dL)    RDW 16.1 (*) 09.6 - 15.5 (%)    Platelets 286  150 - 400 (K/uL)   BASIC METABOLIC PANEL     Status: Abnormal   Collection Time   01/30/12  5:14 AM      Component Value Range Comment   Sodium 137  135 - 145 (mEq/L)    Potassium 3.5  3.5 - 5.1 (mEq/L)    Chloride 106  96 - 112 (mEq/L)    CO2 26  19 - 32 (mEq/L)    Glucose, Bld 115 (*) 70 - 99 (mg/dL)    BUN 5 (*) 6 - 23 (mg/dL)    Creatinine, Ser 0.45  0.50 - 1.35 (mg/dL)    Calcium 8.9  8.4 - 10.5 (mg/dL)    GFR calc non Af Amer 57 (*) >90 (mL/min)    GFR calc Af Amer 66 (*) >90 (mL/min)   GLUCOSE, CAPILLARY     Status: Normal   Collection Time   01/30/12  7:56 AM      Component Value Range Comment   Glucose-Capillary 86  70 - 99 (mg/dL)   BASIC METABOLIC PANEL     Status: Abnormal   Collection Time   01/31/12  4:59 AM      Component Value Range Comment   Sodium 139  135 - 145 (mEq/L)    Potassium 3.5  3.5 - 5.1 (mEq/L)    Chloride 105  96 - 112 (mEq/L)    CO2 25  19 - 32 (mEq/L)    Glucose, Bld 98  70 - 99 (mg/dL)    BUN 5 (*) 6 - 23 (mg/dL)    Creatinine, Ser 4.09  0.50 - 1.35 (mg/dL)    Calcium 9.2  8.4 - 10.5 (mg/dL)    GFR calc non Af Amer 57 (*) >90 (mL/min)    GFR calc Af Amer 66 (*) >90 (mL/min)   CBC     Status: Abnormal   Collection Time   01/31/12  4:59 AM      Component Value Range Comment   WBC 8.0  4.0 - 10.5 (K/uL)    RBC 3.77 (*) 4.22 - 5.81 (MIL/uL)    Hemoglobin 11.0 (*) 13.0 - 17.0 (g/dL)    HCT 81.1 (*) 91.4 - 52.0 (%)    MCV 89.7  78.0 - 100.0 (fL)    MCH 29.2  26.0 - 34.0 (pg)    MCHC 32.5  30.0 - 36.0 (g/dL)    RDW 78.2 (*) 95.6 - 15.5 (%)    Platelets 289  150 - 400 (K/uL)   GLUCOSE, CAPILLARY     Status: Normal   Collection Time   01/31/12  7:35 AM      Component Value Range Comment   Glucose-Capillary 82  70 - 99 (mg/dL)      HPI :21HYQ with 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  Review of pt's recent history shows pt was getting prepped for colonoscopy on 3/8 and was  having some vomiting, so he was sent to the ED with complaints of nausea. Abd plain film  showed mild dilation. He had a few BM's and his stomach was decompressed, felt better. He  was noted to have low grade fever while there, but had negative UA and CXR at that time.  Discharged from the ED. He  also appeared to have WBC count 15 at that time.  Pt cannot give reliable history, suspect due to aphasia, although he is alert, attentive,  and can answer simple questions well, so history gathered from ED staff, who spoke with  the sister when she was here earlier, but not now. Apparently the pt's vitals were  gathered at the facility earlier tonight and noted to be hypotensive for which he was  sent to the ED, but no reported symptoms at all. NH documents only document "resp.  distress" and vitals 108/62 and 100/palp, HR 67, temp 96.4, RR 23, but nothing else. On  arrival per ED he was diaphoretic, but according to sister was at his mental baseline,  and the diaphoresis resolved through the ED course.  In the ED pt was hypotense to 81/68, HR 86, temp 99.6, oxygenating well. Labs significant  for new hypoNa 133 down from 143 on 3/8, renal at baseline Cr 1.3, negative Trop POC,  lactate elevated 2.9, WBC up to 13.4 (was 15.4 on 3/8), rest of CBC normal. CXR with  bilateral infiltrates in RUL and L base.    HOSPITAL COURSE:   Pneumonia  -Healthcare associated pneumonia, seems bibasilar.  -Patient was treated with Zosyn and vancomycin 7 days. Very minimal oxygen requirements.  He is  currently requiring 2 L of oxygen, hemodynamically stable  Sepsis  -Likely secondary to the pneumonia.  -1/2 of blood cultures positive for coagulase negative staph, I think its contaminant.  -Hypotension,  resolved. Vitals are stable.   Anemia gastroenterology consultation was obtained because of the significant drop in hemoglobin to 7.7 X. Patient had an EGD with results as above -Marked drop in the hemoglobin from 14.0 to 9.8 and then 7.7. Currently stable.  Per GI hematemesis was secondary to gastritis, final recommendations are to Continue twice daily Protonix x one month, then once daily PPI long term  Continue Carafate 4x daily x 2 weeks total.  -Status post transfusion of 2 units of blood, hemoglobin stable now.    CKD  -Mild chronic kidney disease, likely stage 2-3  -Creatinine at its baseline 1.1.   History of CVA  -Residual left-sided weakness , aspirin has been discontinued at this time.   Dysphagia  -Secondary to the previous CVA. Patient failed swallow screen in the emergency department.  -Dysphagia 3 with a thin liquids    Disposition  -Remains as inpatient.  -Likely back to the nursing home in the morning.    Discharge Exam: Blood pressure 134/74, pulse 67, temperature 97.8 F (36.6 C), temperature source Oral, resp. rate 17, height 6' (1.829 m), weight 94.5 kg (208 lb 5.4 oz), SpO2 99.00%.   General: Alert, Well-developed, in NAD  Heart: Regular rate and rhythm; no murmurs  Pulm;clear anterior  Abdomen: Soft, nontender and nondistended. Normal bowel sounds, without guarding,  Extremities: Without edema.  Neurologic: Alert and oriented x4; grossly normal neurologically.  Psych: Alert and cooperative. Normal mood and affect.   Discharge Orders    Future Orders Please Complete By Expires   Diet - low sodium heart healthy      Increase activity slowly      Call MD for:  temperature >100.4      Call MD for:  persistant nausea and vomiting      Call MD for:  severe uncontrolled pain         Follow-up Information    Follow up with [facility], [place] .        CBC  in one week  Signed: Richarda Overlie 01/31/2012, 11:27 AM

## 2012-01-31 NOTE — Progress Notes (Signed)
Patient set to discharge back to [facility] today. Message left for patient's sister, Armando Reichert (ph#: [phone]). [facility] called for transport.   Unice Bailey, LCSWA [phone]

## 2012-01-31 NOTE — Progress Notes (Signed)
Patient ID: Isaiah [name], male   DOB: July 08, 1941, 71 y.o.   MRN: [id number] [facility] Progress Note  Subjective: No c/o says he feels OK, No c/o difficulty swallowing, no nausea or vomiting.  Objective:  Vital signs in last 24 hours: Temp:  [97.4 F (36.3 C)-97.8 F (36.6 C)] 97.8 F (36.6 C) (03/27 0520) Pulse Rate:  [67-74] 67  (03/27 0520) Resp:  [16-17] 17  (03/27 0520) BP: (134-152)/(74-78) 134/74 mmHg (03/27 0520) SpO2:  [96 %-99 %] 99 % (03/27 0520) Last BM Date: 01/28/12 General:   Alert,  Well-developed,    in NAD Heart:  Regular rate and rhythm; no murmurs Pulm;clear anterior Abdomen:  Soft, nontender and nondistended. Normal bowel sounds, without guarding, Extremities:  Without edema. Neurologic:  Alert and  oriented x4;  grossly normal neurologically. Psych:  Alert and cooperative. Normal mood and affect.  Intake/Output from previous day: 03/26 0701 - 03/27 0700 In: 1070 [P.O.:320; IV Piggyback:750] Out: 2850 [Urine:2850] Intake/Output this shift:    Lab Results:  Basename 01/31/12 0459 01/30/12 0514 01/29/12 0449  WBC 8.0 8.0 10.2  HGB 11.0* 9.5* 9.2*  HCT 33.8* 29.5* 27.5*  PLT 289 286 272   BMET  Basename 01/31/12 0459 01/30/12 0514 01/29/12 0449  NA 139 137 137  K 3.5 3.5 3.5  CL 105 106 106  CO2 25 26 25   GLUCOSE 98 115* 95  BUN 5* 5* 7  CREATININE 1.25 1.25 1.15  CALCIUM 9.2 8.9 8.7     Assessment / Plan: #1 71 yo male with self limited hematemesis secondary to gastritis. Hgb has been stable Continue twice daily Protonix x one month, then once daily PPI  long term Continue Carafate  4x daily x 2 weeks total. We will sign off, please call for problems Principal Problem:  *Sepsis Active Problems:  CEREBROVASCULAR ACCIDENT WITH LEFT HEMIPARESIS  Hypotension  Healthcare-associated pneumonia  Hyponatremia  CKD (chronic kidney disease) stage 2, GFR 60-89 ml/min  Leukocytosis  Hemorrhage of gastrointestinal tract,  unspecified  Acute posthemorrhagic anemia  Other specified gastritis with hemorrhage     LOS: 7 days   Wagner Tanzi  01/31/2012, 9:09 AM

## 2012-02-13 ENCOUNTER — Telehealth: Payer: Self-pay

## 2012-02-13 ENCOUNTER — Encounter (HOSPITAL_COMMUNITY)
Admission: RE | Disposition: A | Discharge status: Skilled Nursing Facility | Payer: Self-pay | Source: Ambulatory Visit | Attending: Gastroenterology

## 2012-02-13 ENCOUNTER — Ambulatory Visit (HOSPITAL_COMMUNITY)
Admission: RE | Admit: 2012-02-13 | Discharge: 2012-02-13 | Disposition: A | Discharge status: Skilled Nursing Facility | Payer: Medicare Other | Source: Ambulatory Visit | Attending: Gastroenterology | Admitting: Gastroenterology

## 2012-02-13 DIAGNOSIS — K2990 Gastroduodenitis, unspecified, without bleeding: Secondary | ICD-10-CM | POA: Insufficient documentation

## 2012-02-13 DIAGNOSIS — D126 Benign neoplasm of colon, unspecified: Secondary | ICD-10-CM | POA: Insufficient documentation

## 2012-02-13 DIAGNOSIS — K922 Gastrointestinal hemorrhage, unspecified: Secondary | ICD-10-CM | POA: Insufficient documentation

## 2012-02-13 DIAGNOSIS — K2970 Gastritis, unspecified, without bleeding: Secondary | ICD-10-CM | POA: Insufficient documentation

## 2012-02-13 DIAGNOSIS — K222 Esophageal obstruction: Secondary | ICD-10-CM | POA: Insufficient documentation

## 2012-02-13 SURGERY — COLONOSCOPY
Anesthesia: Moderate Sedation

## 2012-02-13 NOTE — Telephone Encounter (Signed)
The patient did not complete the prep at the nursing home and was passing formed stool this am.  The nurse reports that they did not give him all of the prep.  The procedure was canceled by the hospital this am.  I have attempted to reach his sister Ginnie Smart at [phone].  Per Dr Russella Dar if the sister will stay with him for his prep and make sure he takes it then he will attempt again for Thursday or Friday of this week or it will be July.  I have left a message for the sister to call back

## 2012-02-13 NOTE — Telephone Encounter (Signed)
I spoke with the patient's sister at length.  She does not want to repeat the procedure at this time,.  She will talk with her brother and if they change their mind they will call back.

## 2012-02-13 NOTE — Progress Notes (Signed)
Procedure cancelled ,patient's stools still formed,did not drink all of this prep .Procedure is cancelled by Dr Russella Dar .

## 2012-02-14 ENCOUNTER — Encounter (HOSPITAL_COMMUNITY): Payer: Self-pay | Admitting: Gastroenterology

## 2012-02-15 ENCOUNTER — Encounter: Payer: Self-pay | Admitting: Gastroenterology

## 2012-02-15 NOTE — Telephone Encounter (Signed)
Error

## 2013-02-15 DIAGNOSIS — I69959 Hemiplegia and hemiparesis following unspecified cerebrovascular disease affecting unspecified side: Secondary | ICD-10-CM

## 2013-03-21 DIAGNOSIS — F339 Major depressive disorder, recurrent, unspecified: Secondary | ICD-10-CM

## 2013-04-13 DIAGNOSIS — F0390 Unspecified dementia without behavioral disturbance: Secondary | ICD-10-CM

## 2013-05-24 ENCOUNTER — Non-Acute Institutional Stay (SKILLED_NURSING_FACILITY): Payer: PRIVATE HEALTH INSURANCE | Admitting: Internal Medicine

## 2013-05-24 DIAGNOSIS — F339 Major depressive disorder, recurrent, unspecified: Secondary | ICD-10-CM

## 2013-05-24 DIAGNOSIS — I69959 Hemiplegia and hemiparesis following unspecified cerebrovascular disease affecting unspecified side: Secondary | ICD-10-CM

## 2013-05-24 DIAGNOSIS — K222 Esophageal obstruction: Secondary | ICD-10-CM

## 2013-05-24 NOTE — Progress Notes (Signed)
Patient ID: Isaiah [name], male   DOB: January 29, 1941, 72 y.o.   MRN: [id number] Facility; [facility]. Chief complaint review of medical issues, [facility] note for June. History patient has been in the building. I believe since 2000 and. 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 Last hospitalized in March of 2013 with healthcare acquired pneumonia. He is generally remained stable in the facility. He is able to self propel his wheelchair and feed himself. No other acute issues.  Past Medical History  Diagnosis Date  . Hypertension   . Hypercholesterolemia   . 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 Bradycardia 06/2009    HR 30-40's with decreased consciousness  . Renal disorder   . Alcohol use   . GI bleed     H/o hematochezia   . Syncope   . History of colonic polyps   Gastritis.  Esophageal stricutre Past Surgical History  Procedure Laterality Date  . Orif left tibia fracture    . Colonoscopy with polypectomy  2001  . Esophagogastroduodenoscopy  01/29/2012    Procedure: ESOPHAGOGASTRODUODENOSCOPY (EGD);  Surgeon: Hart Carwin, MD;  Location: [facility];  Service: Endoscopy;  Laterality: N/A;  . Colonoscopy  02/13/2012    Procedure: COLONOSCOPY;  Surgeon: Meryl Dare, MD,FACG;  Location: [facility];  Service: Endoscopy;  Laterality: N/A;    Social: Remains a full code.  Physical exam; O2 sat 96% on room air. Pulse 70, respirations 20. Respiratory clear entry bilaterally. Cardiac heart sounds are normal. No murmurs. Abdomen flat, nontender, no liver no spleen.  Impressions/plan #1 late effect nondominant hemisphere CVA. Is on aspirin 81 mg 2 history of esophageal stricture sure secondary to gastroesophageal reflux, he is on Protonix 40 twice a day #3 dementia, question vascular Mini-Mental Status a 23/30. #4 Maj. depression, on  Cymbalta #5Hyperlipidemia; last LDL at 85 12/2012

## 2013-07-02 ENCOUNTER — Non-Acute Institutional Stay (SKILLED_NURSING_FACILITY): Payer: PRIVATE HEALTH INSURANCE | Admitting: Internal Medicine

## 2013-07-02 DIAGNOSIS — K222 Esophageal obstruction: Secondary | ICD-10-CM

## 2013-07-02 DIAGNOSIS — I69959 Hemiplegia and hemiparesis following unspecified cerebrovascular disease affecting unspecified side: Secondary | ICD-10-CM

## 2013-07-02 NOTE — Progress Notes (Signed)
Patient ID: Isaiah [name], male   DOB: 11-01-1941, 72 y.o.   MRN: [id number]     Status: Signed                    Facility; [facility]. Chief complaint review of medical issues, [facility] note for July History patient has been in the building. I believe since 2000 and. 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 Last hospitalized in March of 2013 with healthcare acquired pneumonia. He is generally remained stable in the facility. He is able to self propel his wheelchair and feed himself. No other acute issues.  Note Folstein Mini-Mental Status was 23/30. No active medical concerns    Past Medical History   Diagnosis  Date   .  Hypertension     .  Hypercholesterolemia     .  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  Bradycardia  06/2009       HR 30-40's with decreased consciousness   .  Renal disorder     .  Alcohol use     .  GI bleed   01/2012      H/o hematochezia    .  Syncope     .  History of colonic polyps     Gastritis.   Esophageal stricutre Past Surgical History   Procedure  Laterality  Date   .  Orif left tibia fracture       .  Colonoscopy with polypectomy    2001   .  Esophagogastroduodenoscopy    01/29/2012       Procedure: ESOPHAGOGASTRODUODENOSCOPY (EGD);  Surgeon: Hart Carwin, MD;  Location: [facility];  Service: Endoscopy;  Laterality: N/A;   .  Colonoscopy    02/13/2012       Procedure: COLONOSCOPY;  Surgeon: Meryl Dare, MD,FACG;  Location: [facility];  Service: Endoscopy;  Laterality: N/A;     Social: Remains a full code.  Physical exam; O2 sat 96% on room air. Pulse 70, respirations 20. Respiratory clear entry bilaterally. Cardiac heart sounds are normal. No murmurs. Abdomen flat, nontender, no liver no spleen.  Impressions/plan #1 late effect nondominant hemisphere CVA. Is on aspirin 81 mg 2 history of esophageal  stricture sure secondary to gastroesophageal reflux, he is on Protonix 40 twice a day #3 dementia, question vascular Mini-Mental Status a 23/30. #4 Maj. depression, on Cymbalta #5Hyperlipidemia; last LDL at 85 12/2012

## 2013-07-19 ENCOUNTER — Non-Acute Institutional Stay (SKILLED_NURSING_FACILITY): Payer: PRIVATE HEALTH INSURANCE | Admitting: Internal Medicine

## 2013-07-19 DIAGNOSIS — K222 Esophageal obstruction: Secondary | ICD-10-CM

## 2013-07-19 DIAGNOSIS — I69959 Hemiplegia and hemiparesis following unspecified cerebrovascular disease affecting unspecified side: Secondary | ICD-10-CM

## 2013-07-19 DIAGNOSIS — I10 Essential (primary) hypertension: Secondary | ICD-10-CM

## 2013-07-19 NOTE — Progress Notes (Signed)
Patient ID: Isaiah [name], male   DOB: [birth year], 72 y.o.   MRN: [id number]                          Facility; [facility]. Chief complaint review of medical issues, [facility] note for August History patient has been in the building. I believe since 2000 and. 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 Last hospitalized in March of 2013 with healthcare acquired pneumonia. He is generally remained stable in the facility. He is able to self propel his wheelchair and feed himself. No other acute issues.  Blood pressure is well controlled listed at 134/78  Note Folstein Mini-Mental Status was 23/30. No active medical concerns    Past Medical History   Diagnosis  Date   .  Hypertension     .  Hypercholesterolemia     .  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  Bradycardia  06/2009       HR 30-40's with decreased consciousness   .  Renal disorder     .  Alcohol use     .  GI bleed   01/2012      H/o hematochezia    .  Syncope     .  History of colonic polyps     Gastritis.   Esophageal stricutre Past Surgical History   Procedure  Laterality  Date   .  Orif left tibia fracture       .  Colonoscopy with polypectomy    2001   .  Esophagogastroduodenoscopy    01/29/2012       Procedure: ESOPHAGOGASTRODUODENOSCOPY (EGD);  Surgeon: Hart Carwin, MD;  Location: [facility];  Service: Endoscopy;  Laterality: N/A;   .  Colonoscopy    02/13/2012       Procedure: COLONOSCOPY;  Surgeon: Meryl Dare, MD,FACG;  Location: [facility];  Service: Endoscopy;  Laterality: N/A;     Social: Remains a full code.  Physical exam; O2 sat 95% on room air. Pulse 64, respirations 20. Respiratory clear entry bilaterally. Cardiac heart sounds are normal. No murmurs. Abdomen flat, nontender, no liver no spleen.  Impressions/plan #1 late effect nondominant hemisphere CVA. Is on  aspirin 81 mg 2 history of esophageal stricture sure secondary to gastroesophageal reflux, he is on Protonix 40 twice a day #3 dementia, question vascular Mini-Mental Status a 23/30. #4 Maj. depression, on Cymbalta #5Hyperlipidemia; last LDL at 85 12/2012 #6 hypertension; this is well controlled currently on Norvasc 5 mg

## 2013-08-29 ENCOUNTER — Non-Acute Institutional Stay (SKILLED_NURSING_FACILITY): Payer: PRIVATE HEALTH INSURANCE | Admitting: Internal Medicine

## 2013-08-29 DIAGNOSIS — I69959 Hemiplegia and hemiparesis following unspecified cerebrovascular disease affecting unspecified side: Secondary | ICD-10-CM

## 2013-08-29 DIAGNOSIS — K222 Esophageal obstruction: Secondary | ICD-10-CM

## 2013-08-29 DIAGNOSIS — I10 Essential (primary) hypertension: Secondary | ICD-10-CM

## 2013-08-29 NOTE — Progress Notes (Signed)
Patient ID: Isaiah [name], male   DOB: [birth year], 72 y.o.   MRN: [id number]                          Facility; [facility]. Chief complaint review of medical issues, [facility] note for September History patient has been in the building. I believe since 2000 and. 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 Last hospitalized in March of 2013 with healthcare acquired pneumonia. He is generally remained stable in the facility. He is able to self propel his wheelchair and feed himself. No other acute issues.  Blood pressure is satisfactorily controlled  Note Folstein Mini-Mental Status was 23/30. No active medical concerns    Past Medical History   Diagnosis  Date   .  Hypertension     .  Hypercholesterolemia     .  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  Bradycardia  06/2009       HR 30-40's with decreased consciousness   .  Renal disorder     .  Alcohol use     .  GI bleed   01/2012      H/o hematochezia    .  Syncope     .  History of colonic polyps     Gastritis.   Esophageal stricutre Past Surgical History   Procedure  Laterality  Date   .  Orif left tibia fracture       .  Colonoscopy with polypectomy    2001   .  Esophagogastroduodenoscopy    01/29/2012       Procedure: ESOPHAGOGASTRODUODENOSCOPY (EGD);  Surgeon: Hart Carwin, MD;  Location: [facility];  Service: Endoscopy;  Laterality: N/A;   .  Colonoscopy    02/13/2012       Procedure: COLONOSCOPY;  Surgeon: Meryl Dare, MD,FACG;  Location: [facility];  Service: Endoscopy;  Laterality: N/A;     Social: Remains a full code.  Physical exam; O2 sat 95% on room air. Pulse 64, respirations 20. Respiratory clear entry bilaterally. Cardiac heart sounds are normal. No murmurs. Abdomen flat, nontender, no liver no spleen.  Impressions/plan #1 late effect nondominant hemisphere CVA. Is on  aspirin 81 mg 2 history of esophageal stricture sure secondary to gastroesophageal reflux, he is on Protonix 40 twice a day #3 dementia, question vascular Mini-Mental Status a 23/30. #4 Maj. depression, on Cymbalta #5Hyperlipidemia; last LDL at 85 12/2012 #6 hypertension; this is well controlled currently on Norvasc 5 mg

## 2013-09-09 IMAGING — CR DG ABDOMEN ACUTE W/ 1V CHEST
4 series · 4 of 4 positions shown · non-contrast
Comparison: 06/09/2004 CT

CLINICAL DATA: 70-year-old male with abdominal pain and distention.

ACUTE ABDOMEN SERIES (ABDOMEN 2 VIEW & CHEST 1 VIEW)

[w abdomen decub]
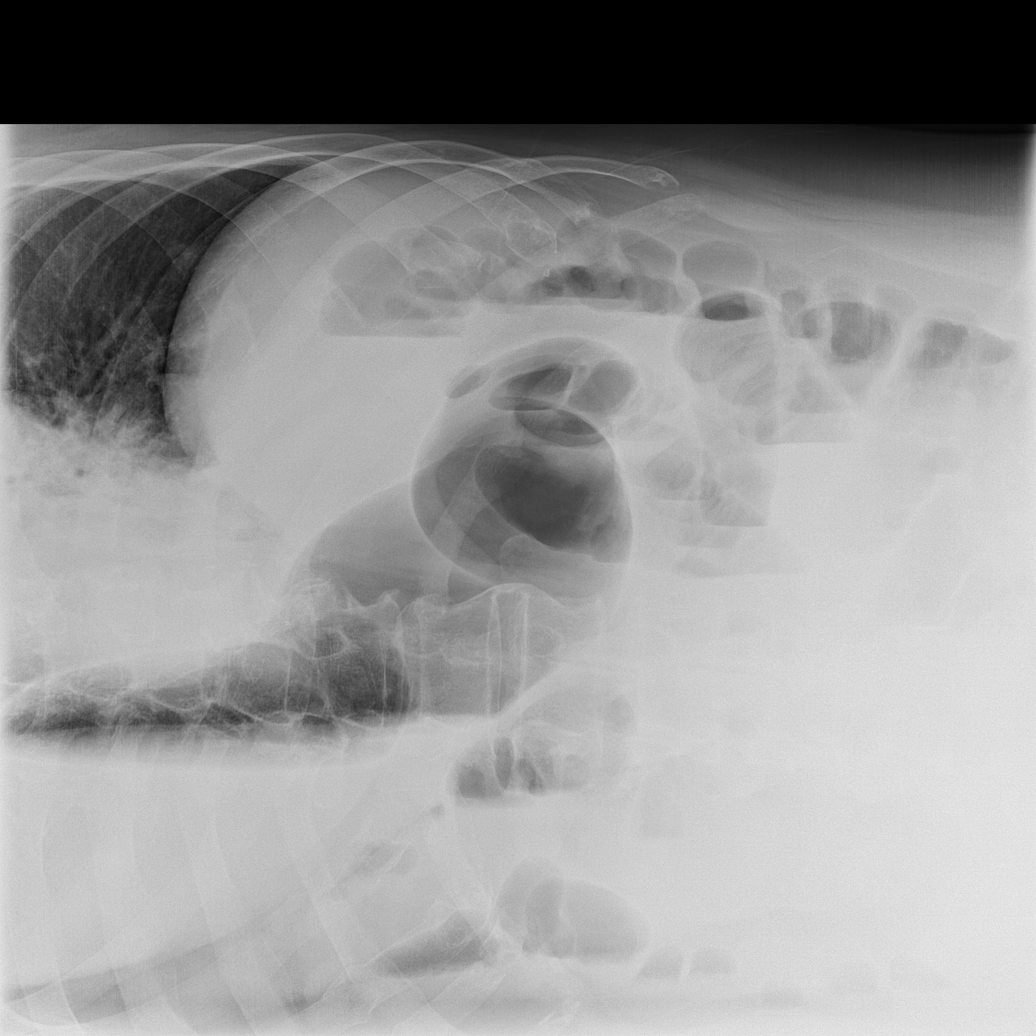

[x abdomen supine (1 of 2)]
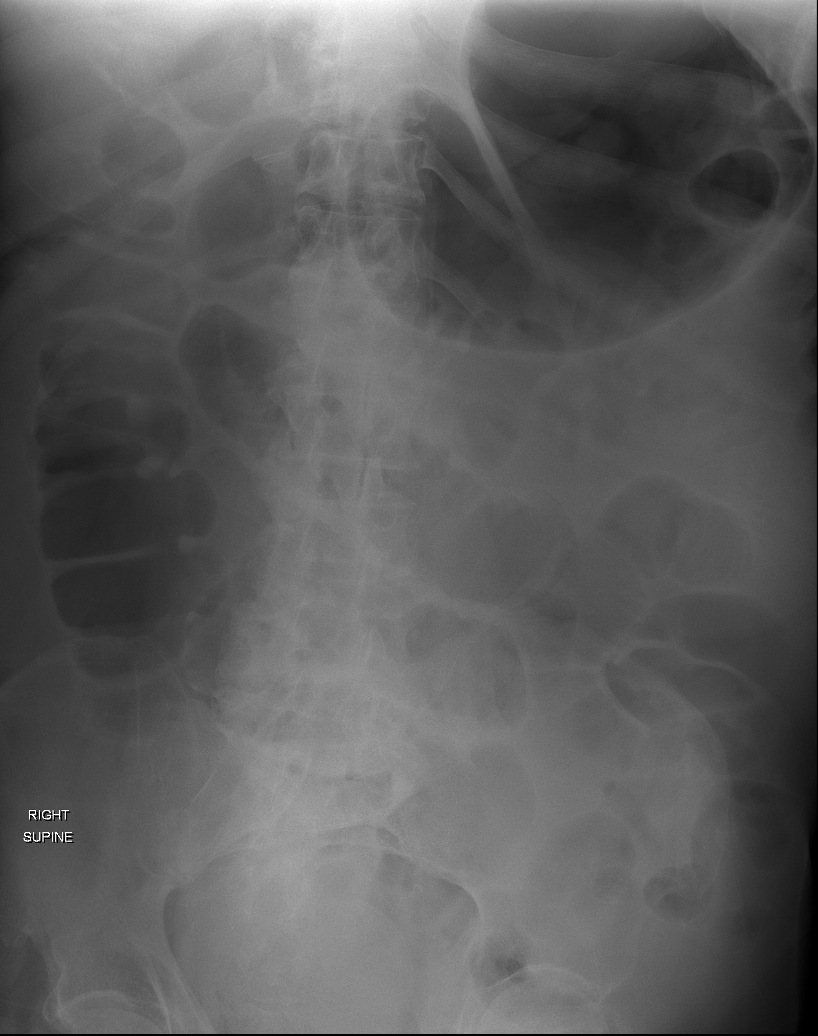

[x abdomen supine (2 of 2)]
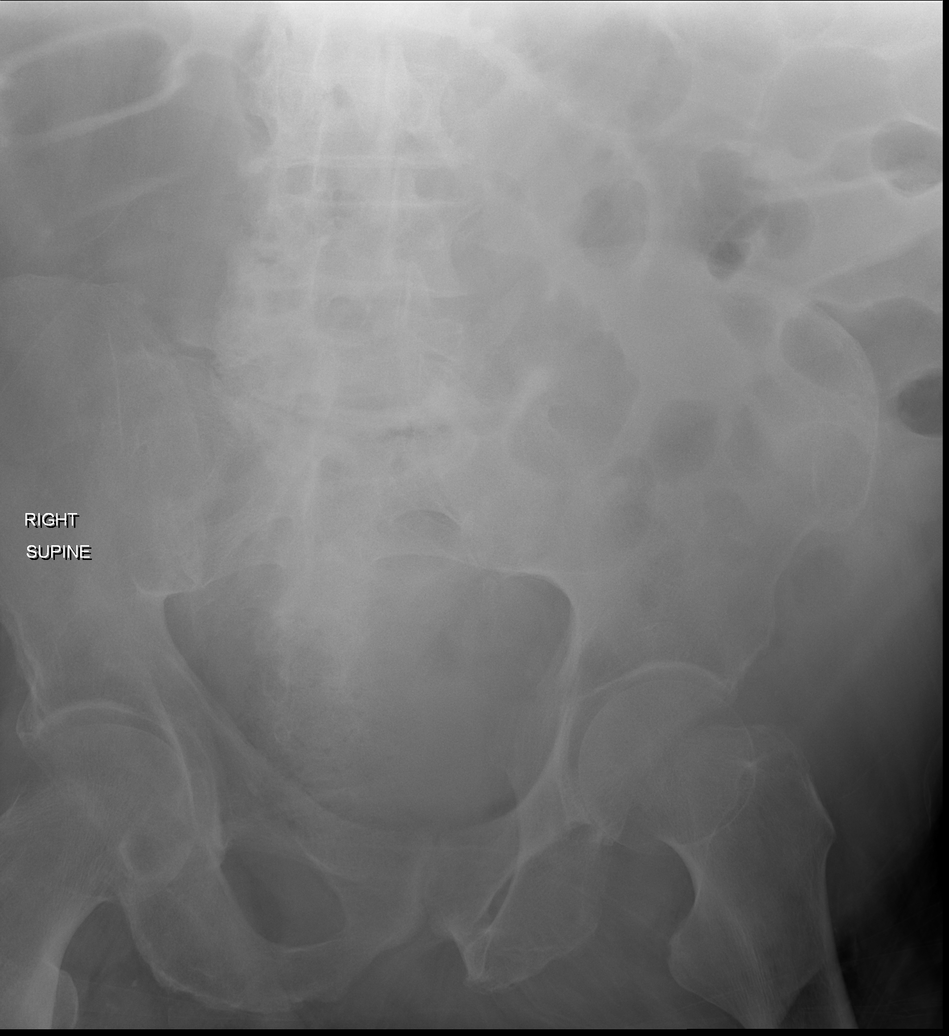

[x chest ap]
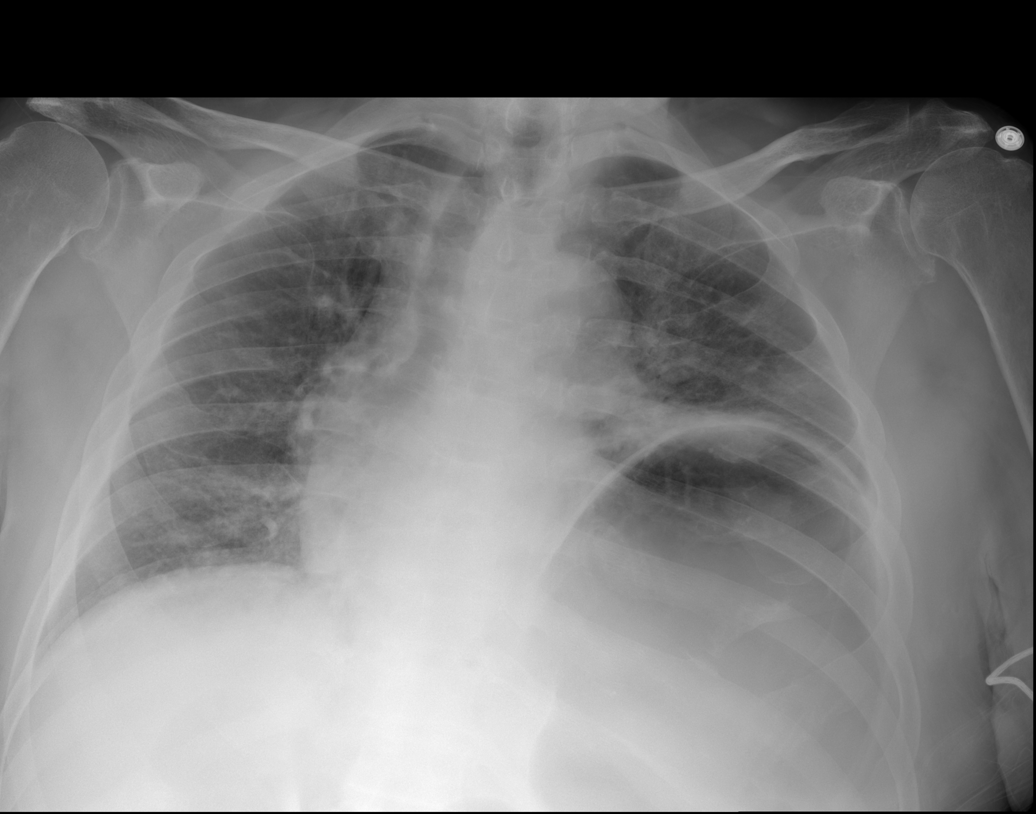

[4 of 4 positions shown; findings below may reference images not displayed]

FINDINGS: Elevated left hemidiaphragm is again noted with left
basilar atelectasis/scarring.
Upper limits normal heart size again identified.
No definite focal airspace disease, pleural effusion or
pneumothorax noted.

Mildly distended gas and fluid filled loops of small bowel, stomach
and colon are noted.
Stool in the rectum is identified.
There is no evidence of pneumoperitoneum.
No suspicious calcifications are present.
No acute bony abnormalities are identified.
IMPRESSION: Nonspecific bowel gas pattern with mildly distended gas and fluid
filled stomach, small bowel and colon.  This most likely represents
a gastroenteritis.  No evidence of pneumoperitoneum.

No evidence of acute cardiopulmonary disease.

## 2013-09-27 ENCOUNTER — Non-Acute Institutional Stay (SKILLED_NURSING_FACILITY): Payer: PRIVATE HEALTH INSURANCE | Admitting: Internal Medicine

## 2013-09-27 DIAGNOSIS — I69959 Hemiplegia and hemiparesis following unspecified cerebrovascular disease affecting unspecified side: Secondary | ICD-10-CM

## 2013-09-27 DIAGNOSIS — I10 Essential (primary) hypertension: Secondary | ICD-10-CM

## 2013-09-27 NOTE — Progress Notes (Signed)
Patient ID: Isaiah [name], male   DOB: 08-Dec-1940, 72 y.o.   MRN: [id number]                          Facility; [facility]. Chief complaint review of medical issues, [facility] note for October History patient has been in the building. I believe since 2000 and. 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 Last hospitalized in March of 2013 with healthcare acquired pneumonia. He is generally remained stable in the facility. He is able to self propel his wheelchair and feed himself. No other acute issues.  Blood pressure is satisfactorily controlled  Note Folstein Mini-Mental Status was 23/30. No active medical concerns    Past Medical History   Diagnosis  Date   .  Hypertension     .  Hypercholesterolemia     .  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  Bradycardia  06/2009       HR 30-40's with decreased consciousness   .  Renal disorder     .  Alcohol use     .  GI bleed   01/2012      H/o hematochezia    .  Syncope     .  History of colonic polyps     Gastritis.   Esophageal stricutre Past Surgical History   Procedure  Laterality  Date   .  Orif left tibia fracture       .  Colonoscopy with polypectomy    2001   .  Esophagogastroduodenoscopy    01/29/2012       Procedure: ESOPHAGOGASTRODUODENOSCOPY (EGD);  Surgeon: Hart Carwin, MD;  Location: [facility];  Service: Endoscopy;  Laterality: N/A;   .  Colonoscopy    02/13/2012       Procedure: COLONOSCOPY;  Surgeon: Meryl Dare, MD,FACG;  Location: [facility];  Service: Endoscopy;  Laterality: N/A;     Social: Remains a full code.  Physical exam; O2 sat 95% on room air. Pulse 66, respirations 20. BP 127/80 Respiratory clear entry bilaterally. Cardiac heart sounds are normal. No murmurs. Abdomen flat, nontender, no liver no spleen.  Impressions/plan #1 late effect nondominant hemisphere CVA.  Is on aspirin 81 mg 2 history of esophageal stricture sure secondary to gastroesophageal reflux, he is on Protonix 40 twice a day #3 dementia, question vascular Mini-Mental Status a 23/30. #4 Maj. depression, on Cymbalta #5Hyperlipidemia; last LDL at 85 12/2012 #6 hypertension; this is well controlled currently on Norvasc 5 mg

## 2013-10-20 ENCOUNTER — Non-Acute Institutional Stay (SKILLED_NURSING_FACILITY): Payer: PRIVATE HEALTH INSURANCE | Admitting: Internal Medicine

## 2013-10-20 DIAGNOSIS — I69959 Hemiplegia and hemiparesis following unspecified cerebrovascular disease affecting unspecified side: Secondary | ICD-10-CM

## 2013-10-20 DIAGNOSIS — K222 Esophageal obstruction: Secondary | ICD-10-CM

## 2013-10-20 NOTE — Progress Notes (Signed)
Patient ID: Isaiah [name], male   DOB: [birth year], 72 y.o.   MRN: [id number]      Facility; [facility]. Chief complaint review of medical issues, [facility] note for November History patient has been in the building. I believe since 2000 and. 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 Last hospitalized in March of 2013 with healthcare acquired pneumonia. He is generally remained stable in the facility. He is able to self propel his wheelchair and feed himself. No other acute issues.  Blood pressure is satisfactorily controlled  Note Folstein Mini-Mental Status was 23/30. Was seen by psychiatry for increased irritability and started on Lamictal.    Past Medical History   Diagnosis  Date   .  Hypertension     .  Hypercholesterolemia     .  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  Bradycardia  06/2009       HR 30-40's with decreased consciousness   .  Renal disorder     .  Alcohol use     .  GI bleed   01/2012      H/o hematochezia    .  Syncope     .  History of colonic polyps     Gastritis.   Esophageal stricutre Past Surgical History   Procedure  Laterality  Date   .  Orif left tibia fracture       .  Colonoscopy with polypectomy    2001   .  Esophagogastroduodenoscopy    01/29/2012       Procedure: ESOPHAGOGASTRODUODENOSCOPY (EGD);  Surgeon: Hart Carwin, MD;  Location: [facility];  Service: Endoscopy;  Laterality: N/A;   .  Colonoscopy    02/13/2012       Procedure: COLONOSCOPY;  Surgeon: Meryl Dare, MD,FACG;  Location: [facility];  Service: Endoscopy;  Laterality: N/A;     Social: Remains a full code.  Physical exam; O2 sat 94% on room air. Pulse 66, respirations 20. BP 140/70 Respiratory clear entry bilaterally. Cardiac heart sounds are normal. No murmurs. Abdomen flat, nontender, no liver no spleen.  Impressions/plan #1 late effect  nondominant hemisphere CVA. Is on aspirin 81 mg 2 history of esophageal stricture sure secondary to gastroesophageal reflux, he is on Protonix 40 twice a day. This has been a major issue in the past with upper GI bleeding esophageal stricture #3 dementia, question vascular Mini-Mental Status a 23/30. #4 Maj. depression, on Cymbalta. Limit the LAD #5Hyperlipidemia; last LDL at 85 12/2012 #6 hypertension; patient is well controlled

## 2013-12-05 ENCOUNTER — Non-Acute Institutional Stay (SKILLED_NURSING_FACILITY): Payer: PRIVATE HEALTH INSURANCE | Admitting: Internal Medicine

## 2013-12-05 DIAGNOSIS — I69959 Hemiplegia and hemiparesis following unspecified cerebrovascular disease affecting unspecified side: Secondary | ICD-10-CM

## 2013-12-05 DIAGNOSIS — K222 Esophageal obstruction: Secondary | ICD-10-CM

## 2013-12-05 DIAGNOSIS — I10 Essential (primary) hypertension: Secondary | ICD-10-CM

## 2013-12-05 NOTE — Progress Notes (Signed)
Patient ID: Isaiah [name], male   DOB: 01/17/1941, 73 y.o.   MRN: [id number]      Facility; [facility]. Chief complaint review of medical issues, [employer] note for December History patient has been in the building. I believe since 2000 and. 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 Last hospitalized in March of 2013 with healthcare acquired pneumonia. He is generally remained stable in the facility. He is able to self propel his wheelchair and feed himself. No other acute issues.  Blood pressure is satisfactorily controlled  Note Folstein Mini-Mental Status was 23/30. Was seen by psychiatry for increased irritability and started on Lamictal. He has been receiving speech therapy for dysphagia.    Past Medical History   Diagnosis  Date   .  Hypertension     .  Hypercholesterolemia     .  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  Bradycardia  06/2009       HR 30-40's with decreased consciousness   .  Renal disorder     .  Alcohol use     .  GI bleed   01/2012      H/o hematochezia    .  Syncope     .  History of colonic polyps     Gastritis.   Esophageal stricutre Past Surgical History   Procedure  Laterality  Date   .  Orif left tibia fracture       .  Colonoscopy with polypectomy    2001   .  Esophagogastroduodenoscopy    01/29/2012       Procedure: ESOPHAGOGASTRODUODENOSCOPY (EGD);  Surgeon: Lafayette Dragon, MD;  Location: [facility];  Service: Endoscopy;  Laterality: N/A;   .  Colonoscopy    02/13/2012       Procedure: COLONOSCOPY;  Surgeon: Ladene Artist, MD,FACG;  Location: [facility];  Service: Endoscopy;  Laterality: N/A;     Social: Remains a full code.  Physical exam; O2 sat 94% on room air. Pulse 66, respirations 20. BP 140/70 Respiratory clear entry bilaterally. Cardiac heart sounds are normal. No murmurs. Abdomen flat, nontender, no liver no  spleen.  Impressions/plan #1 late effect nondominant hemisphere CVA. Is on aspirin 81 mg 2 history of esophageal stricture sure secondary to gastroesophageal reflux, he is on Protonix 40 twice a day. This has been a major issue in the past with upper GI bleeding esophageal stricture. Petra Kuba of his current swallowing problem is not clear. ?UGI #3 dementia, question vascular Mini-Mental Status a 23/30. #4 Maj. depression, on Cymbalta. lamictal #5Hyperlipidemia; last LDL at 85 12/2012 #6 hypertension; patient is well controlled

## 2013-12-19 ENCOUNTER — Encounter: Payer: Self-pay | Admitting: *Deleted

## 2014-01-11 ENCOUNTER — Non-Acute Institutional Stay (SKILLED_NURSING_FACILITY): Payer: PRIVATE HEALTH INSURANCE | Admitting: Internal Medicine

## 2014-01-11 DIAGNOSIS — I10 Essential (primary) hypertension: Secondary | ICD-10-CM

## 2014-01-11 DIAGNOSIS — I69959 Hemiplegia and hemiparesis following unspecified cerebrovascular disease affecting unspecified side: Secondary | ICD-10-CM

## 2014-01-11 DIAGNOSIS — F339 Major depressive disorder, recurrent, unspecified: Secondary | ICD-10-CM

## 2014-01-11 NOTE — Progress Notes (Signed)
Patient ID: Isaiah [name], male   DOB: 17-Sep-1941, 73 y.o.   MRN: [id number]      Facility; [facility]. Chief complaint review of medical issues, [employer] note for February History patient has been in the building. I believe since 2000 and. 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 Last hospitalized in March of 2013 with healthcare acquired pneumonia. He is generally remained stable in the facility. He is able to self propel his wheelchair and feed himself. No other acute issues.  Blood pressure is satisfactorily controlled  Note Folstein Mini-Mental Status was 23/30. Was seen by psychiatry for increased irritability and started on Lamictal. He has been receiving speech therapy for dysphagia.  Medications; reviewed    Past Medical History   Diagnosis  Date   .  Hypertension     .  Hypercholesterolemia     .  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  Bradycardia  06/2009       HR 30-40's with decreased consciousness   .  Renal disorder     .  Alcohol use     .  GI bleed   01/2012      H/o hematochezia    .  Syncope     .  History of colonic polyps     Gastritis.   Esophageal stricutre Past Surgical History   Procedure  Laterality  Date   .  Orif left tibia fracture       .  Colonoscopy with polypectomy    2001   .  Esophagogastroduodenoscopy    01/29/2012       Procedure: ESOPHAGOGASTRODUODENOSCOPY (EGD);  Surgeon: Lafayette Dragon, MD;  Location: [facility];  Service: Endoscopy;  Laterality: N/A;   .  Colonoscopy    02/13/2012       Procedure: COLONOSCOPY;  Surgeon: Ladene Artist, MD,FACG;  Location: [facility];  Service: Endoscopy;  Laterality: N/A;     Social: Remains a full code.  Physical exam; temperature is 90.7, respirations 19, pulse 61, blood pressure 112/71 weight is 244 pounds O2 sat 95% on room air Respiratory clear entry  bilaterally. Cardiac heart sounds are normal. No murmurs. Abdomen flat, nontender, no liver no spleen.  Impressions/plan #1 late effect nondominant hemisphere CVA. Is on aspirin 81 mg 2 history of esophageal stricture sure secondary to gastroesophageal reflux, he is on Protonix 40 qd. This has been a major issue in the past with upper GI bleeding esophageal stricture. Petra Kuba of his current swallowing problem is not clear. ?UGI #3 dementia, question vascular Mini-Mental Status a 23/30. #4 Maj. depression, on Cymbalta. lamictal #5Hyperlipidemia; last LDL at 85 12/2012 #6 hypertension; patient is well controlled. On Norvasc #7 behavioral issues on Lamictal.

## 2014-02-24 ENCOUNTER — Non-Acute Institutional Stay (SKILLED_NURSING_FACILITY): Payer: PRIVATE HEALTH INSURANCE | Admitting: Internal Medicine

## 2014-02-24 DIAGNOSIS — I69959 Hemiplegia and hemiparesis following unspecified cerebrovascular disease affecting unspecified side: Secondary | ICD-10-CM

## 2014-02-24 DIAGNOSIS — I10 Essential (primary) hypertension: Secondary | ICD-10-CM

## 2014-02-24 NOTE — Progress Notes (Signed)
Patient ID: Isaiah [name], male   DOB: Sep 04, 1941, 73 y.o.   MRN: [id number]      Facility; [facility]. Chief complaint review of medical issues, [employer] note for march History patient has been in the building. I believe since 2000 and. 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 Last hospitalized in March of 2013 with healthcare acquired pneumonia. He is generally remained stable in the facility. He is able to self propel his wheelchair and feed himself. No other acute issues.  Blood pressure is satisfactorily controlled  Note Folstein Mini-Mental Status was 23/30. Was seen by psychiatry for increased irritability and started on Lamictal. He has been receiving speech therapy for dysphagia.  Medications; reviewed    Past Medical History   Diagnosis  Date   .  Hypertension     .  Hypercholesterolemia     .  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  Bradycardia  06/2009       HR 30-40's with decreased consciousness   .  Renal disorder     .  Alcohol use     .  GI bleed   01/2012      H/o hematochezia    .  Syncope     .  History of colonic polyps     Gastritis.   Esophageal stricutre Past Surgical History   Procedure  Laterality  Date   .  Orif left tibia fracture       .  Colonoscopy with polypectomy    2001   .  Esophagogastroduodenoscopy    01/29/2012       Procedure: ESOPHAGOGASTRODUODENOSCOPY (EGD);  Surgeon: Lafayette Dragon, MD;  Location: [facility];  Service: Endoscopy;  Laterality: N/A;   .  Colonoscopy    02/13/2012       Procedure: COLONOSCOPY;  Surgeon: Ladene Artist, MD,FACG;  Location: [facility];  Service: Endoscopy;  Laterality: N/A;     Social: Remains a full code.  Physical exam; temperature 97.1-pulse 66-respirations 18-blood pressure 130/74-weight 239 pounds-O2 sat 96% on room air Respiratory clear entry bilaterally. Cardiac heart  sounds are normal. No murmurs. Abdomen flat, nontender, no liver no spleen.  Impressions/plan #1 late effect nondominant hemisphere CVA. Is on aspirin 81 mg 2 history of esophageal stricture sure secondary to gastroesophageal reflux, he is on Protonix 40 qd. This has been a major issue in the past with upper GI bleeding esophageal stricture. Petra Kuba of his current swallowing problem is not clear. ?UGI #3 dementia, question vascular Mini-Mental Status a 23/30. #4 Maj. depression, on Cymbalta. lamictal #5Hyperlipidemia; last LDL at 85 12/2012 #6 hypertension; patient is well controlled. On Norvasc. Appears to be well controlled #7 behavioral issues on Lamictal.

## 2014-04-03 ENCOUNTER — Non-Acute Institutional Stay (SKILLED_NURSING_FACILITY): Payer: PRIVATE HEALTH INSURANCE | Admitting: Internal Medicine

## 2014-04-03 ENCOUNTER — Encounter: Payer: Self-pay | Admitting: *Deleted

## 2014-04-03 DIAGNOSIS — I10 Essential (primary) hypertension: Secondary | ICD-10-CM

## 2014-04-03 DIAGNOSIS — I69959 Hemiplegia and hemiparesis following unspecified cerebrovascular disease affecting unspecified side: Secondary | ICD-10-CM

## 2014-04-03 DIAGNOSIS — K222 Esophageal obstruction: Secondary | ICD-10-CM

## 2014-04-03 NOTE — Progress Notes (Signed)
Patient ID: Isaiah [name], male   DOB: 19-Jun-1941, 73 y.o.   MRN: [id number]       Facility; [facility]. Chief complaint review of medical issues, [employer] note for April History patient has been in the building. I believe since 2000 and. 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 Last hospitalized in March of 2013 with healthcare acquired pneumonia. He is generally remained stable in the facility. He is able to self propel his wheelchair and feed himself. No other acute issues. He was treated this month for his folliculitis involving his back with topical antibiotics this appears to have had good result.  Blood pressure is satisfactorily controlled  Note Folstein Mini-Mental Status was 23/30. Was seen by psychiatry for increased irritability and started on Lamictal. He has been receiving speech therapy for dysphagia.  Medications; reviewed Norvasc 5 mg daily Cymbalta 30 mg daily ASA 81 daily Protonic 40 mg daily Senokot-S 3 tablets by mouth daily Tylenol Extra Strength 500 3 times a day Lamictal 50 mg twice a day for mood stabilization Zocor 20 mg    Past Medical History   Diagnosis  Date   .  Hypertension     .  Hypercholesterolemia     .  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  Bradycardia  06/2009       HR 30-40's with decreased consciousness   .  Renal disorder     .  Alcohol use     .  GI bleed   01/2012      H/o hematochezia    .  Syncope     .  History of colonic polyps     Gastritis.   Esophageal stricutre Past Surgical History   Procedure  Laterality  Date   .  Orif left tibia fracture       .  Colonoscopy with polypectomy    2001   .  Esophagogastroduodenoscopy    01/29/2012       Procedure: ESOPHAGOGASTRODUODENOSCOPY (EGD);  Surgeon: Lafayette Dragon, MD;  Location: [facility];  Service: Endoscopy;  Laterality: N/A;   .  Colonoscopy     02/13/2012       Procedure: COLONOSCOPY;  Surgeon: Ladene Artist, MD,FACG;  Location: [facility];  Service: Endoscopy;  Laterality: N/A;     Social: Remains a full code.  Physical exam;  Vitals; temperature 98.4-pulse 72-respirations 18-blood pressure 146/70-weight 244 pounds-O2 sat 96% on room air Respiratory clear entry bilaterally. Cardiac heart sounds are normal. No murmurs. Abdomen flat, nontender, no liver no spleen.  Impressions/plan #1 late effect nondominant hemisphere CVA. Is on aspirin 81 mg 2 history of esophageal stricture sure secondary to gastroesophageal reflux, he is on Protonix 40 qd. This has been a major issue in the past with upper GI bleeding esophageal stricture. Petra Kuba of his current swallowing problem is not clear. ?UGI #3 dementia, question vascular Mini-Mental Status a 23/30. #4 Maj. depression, on Cymbalta. lamictal #5Hyperlipidemia; last LDL at 85 12/2012 #6 hypertension; patient is well controlled. On Norvasc. Appears to be well controlled #7 behavioral issues on Lamictal.

## 2014-04-17 ENCOUNTER — Non-Acute Institutional Stay (SKILLED_NURSING_FACILITY): Payer: PRIVATE HEALTH INSURANCE | Admitting: Internal Medicine

## 2014-04-17 DIAGNOSIS — I69959 Hemiplegia and hemiparesis following unspecified cerebrovascular disease affecting unspecified side: Secondary | ICD-10-CM

## 2014-04-17 DIAGNOSIS — I10 Essential (primary) hypertension: Secondary | ICD-10-CM

## 2014-04-17 DIAGNOSIS — F339 Major depressive disorder, recurrent, unspecified: Secondary | ICD-10-CM

## 2014-04-17 NOTE — Progress Notes (Signed)
Patient ID: Isaiah [name], male   DOB: Mar 25, 1941, 73 y.o.   MRN: [id number]       Facility; [facility]. Chief complaint review of medical issues, [employer] note for May History patient has been in the building. I believe since 2000 and. 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 Last hospitalized in March of 2013 with healthcare acquired pneumonia. He is generally remained stable in the facility. He is able to self propel his wheelchair and feed himself. No other acute issues. He was treated this month for his folliculitis involving his back with topical antibiotics this appears to have had good result.  Blood pressure is satisfactorily controlled  Note Folstein Mini-Mental Status was 23/30. Was seen by psychiatry for increased irritability and started on Lamictal. He has been receiving speech therapy for dysphagia.  Medications; reviewed Norvasc 5 mg daily Cymbalta 30 mg daily ASA 81 daily Protonic 40 mg daily Senokot-S 3 tablets by mouth daily Tylenol Extra Strength 500 3 times a day Lamictal 50 mg twice a day for mood stabilization Zocor 20 mg  Labs; from 03/18/2014 CBC with differential is normal. Comprehensive metabolic panel was normal except for creatinine of 1.28 and an estimated GFR of 64 hemoglobin A1c is 5.8 TSH of 1.53    Past Medical History   Diagnosis  Date   .  Hypertension     .  Hypercholesterolemia     .  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  Bradycardia  06/2009       HR 30-40's with decreased consciousness   .  Renal disorder     .  Alcohol use     .  GI bleed   01/2012      H/o hematochezia    .  Syncope     .  History of colonic polyps     Gastritis.   Esophageal stricutre Past Surgical History   Procedure  Laterality  Date   .  Orif left tibia fracture       .  Colonoscopy with polypectomy    2001   .   Esophagogastroduodenoscopy    01/29/2012       Procedure: ESOPHAGOGASTRODUODENOSCOPY (EGD);  Surgeon: Lafayette Dragon, MD;  Location: [facility];  Service: Endoscopy;  Laterality: N/A;   .  Colonoscopy    02/13/2012       Procedure: COLONOSCOPY;  Surgeon: Ladene Artist, MD,FACG;  Location: [facility];  Service: Endoscopy;  Laterality: N/A;     Social: Remains a full code.  Physical exam;  Vitals; temperature 90.7-pulse 89-respirations 16-blood pressure 127/62-weight 243 pounds-O2 sat is 97% on room air Respiratory clear entry bilaterally. Cardiac heart sounds are normal. No murmurs. Abdomen flat, nontender, no liver no spleen.  Impressions/plan #1 late effect nondominant hemisphere CVA. Is on aspirin 81 mg 2 history of esophageal stricture sure secondary to gastroesophageal reflux, he is on Protonix 40 qd. This has been a major issue in the past with upper GI bleeding esophageal stricture. Petra Kuba of his current swallowing problem is not clear. ?UGI #3 dementia, question vascular Mini-Mental Status a 23/30. #4 Maj. depression, on Cymbalta. lamictal #5Hyperlipidemia; last LDL at 85 12/2012 #6 hypertension; patient is well controlled. On Norvasc. Appears to be well controlled #7 behavioral issues on Lamictal.

## 2014-05-30 ENCOUNTER — Non-Acute Institutional Stay (SKILLED_NURSING_FACILITY): Payer: PRIVATE HEALTH INSURANCE | Admitting: Internal Medicine

## 2014-05-30 DIAGNOSIS — I10 Essential (primary) hypertension: Secondary | ICD-10-CM

## 2014-05-30 DIAGNOSIS — K222 Esophageal obstruction: Secondary | ICD-10-CM

## 2014-05-30 DIAGNOSIS — E785 Hyperlipidemia, unspecified: Secondary | ICD-10-CM

## 2014-05-30 NOTE — Progress Notes (Signed)
Patient ID: Isaiah [name], male   DOB: [birth year], 73 y.o.   MRN: [id number]       Facility; [facility]. Chief complaint review of medical issues, [employer] note for June History patient has been in the building. I believe since 2000 and. 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 Last hospitalized in March of 2013 with healthcare acquired pneumonia. He is generally remained stable in the facility. He is able to self propel his wheelchair and feed himself. No other   issues. He was treated this month for his folliculitis involving his back with topical antibiotics this appears to have had good result.  Blood pressure is satisfactorily controlled  Note Folstein Mini-Mental Status was 23/30. Was seen by psychiatry for increased irritability and started on Lamictal. He has been receiving speech therapy for dysphagia.  Social; he is a full code  Medications; reviewed Norvasc 5 mg daily Cymbalta 30 mg daily ASA 81 daily Protonic 40 mg daily Senokot-S 3 tablets by mouth daily Tylenol Extra Strength 500 3 times a day Lamictal 50 mg twice a day for mood stabilization Zocor 20 mg   Labs; from 03/18/2014 CBC with differential is normal. Comprehensive metabolic panel was normal except for creatinine of 1.28 and an estimated GFR of 64 hemoglobin A1c is 5.8 TSH of 1.53. Last LDL was 61 on 2/9    Past Medical History   Diagnosis  Date   .  Hypertension     .  Hypercholesterolemia     .  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  Bradycardia  06/2009       HR 30-40's with decreased consciousness   .  Renal disorder     .  Alcohol use     .  GI bleed   01/2012      H/o hematochezia    .  Syncope     .  History of colonic polyps     Gastritis.   Esophageal stricutre Past Surgical History   Procedure  Laterality  Date   .  Orif left tibia fracture       .  Colonoscopy  with polypectomy    2001   .  Esophagogastroduodenoscopy    01/29/2012       Procedure: ESOPHAGOGASTRODUODENOSCOPY (EGD);  Surgeon: Lafayette Dragon, MD;  Location: [facility];  Service: Endoscopy;  Laterality: N/A;   .  Colonoscopy    02/13/2012       Procedure: COLONOSCOPY;  Surgeon: Ladene Artist, MD,FACG;  Location: [facility];  Service: Endoscopy;  Laterality: N/A;     Social: Remains a full code.  Physical exam;  Vitals; temperature 97.8-pulse 83-respirations 20-blood pressure 132/82-weight is 237 pounds-O2 sat is 98% on room air Respiratory clear entry bilaterally. Cardiac heart sounds are normal. No murmurs. Abdomen flat, nontender, no liver no spleen.  Impressions/plan #1 late effect nondominant hemisphere CVA. Is on aspirin 81 mg 2 history of esophageal stricture sure secondary to gastroesophageal reflux, he is on Protonix 40 qd. This has been a major issue in the past with upper GI bleeding esophageal stricture. Petra Kuba of his current swallowing problem is not clear. ?UGI #3 dementia, question vascular Mini-Mental Status a 23/30. #4 Maj. depression, on Cymbalta. lamictal #5Hyperlipidemia; last LDL at 85 12/2012 #6 hypertension; patient is well controlled. On Norvasc. Appears to be well controlled #  7 behavioral issues on Lamictal.

## 2014-06-03 ENCOUNTER — Non-Acute Institutional Stay (SKILLED_NURSING_FACILITY): Payer: PRIVATE HEALTH INSURANCE | Admitting: Internal Medicine

## 2014-06-03 DIAGNOSIS — N182 Chronic kidney disease, stage 2 (mild): Secondary | ICD-10-CM

## 2014-06-04 NOTE — Progress Notes (Signed)
         PROGRESS NOTE  DATE: 06/03/2014  FACILITY:  [facility]  LEVEL OF CARE: SNF (31)  Acute Visit  CHIEF COMPLAINT:  Manage chronic kidney disease stage II  HISTORY OF PRESENT ILLNESS: I was requested by the staff to assess the patient regarding above problem(s):  CHRONIC KIDNEY DISEASE: The patient's chronic kidney disease remains stable.  Patient denies increasing lower extremity swelling or confusion. Last creatinine is 1.29 on 06-02-14. In 5-15 creatinine 1.28.  PAST MEDICAL HISTORY : Reviewed.  No changes/see problem list  CURRENT MEDICATIONS: Reviewed per MAR/see medication list  PHYSICAL EXAMINATION  VS: see VS section  GENERAL: no acute distress, moderately obese body habitus RESPIRATORY: breathing is even & unlabored, BS CTAB CARDIAC: RRR, no murmur,no extra heart sounds, no edema  LABS/RADIOLOGY: See history of present illness  ASSESSMENT/PLAN:  Chronic kidney disease stage II-stable  CPT CODE: 43888  Merlene Laughter, MD [place] [phone]

## 2014-06-24 ENCOUNTER — Institutional Professional Consult (permissible substitution): Payer: Medicare Other | Admitting: Internal Medicine

## 2014-06-26 ENCOUNTER — Non-Acute Institutional Stay (SKILLED_NURSING_FACILITY): Payer: PRIVATE HEALTH INSURANCE | Admitting: Internal Medicine

## 2014-06-26 ENCOUNTER — Ambulatory Visit (INDEPENDENT_AMBULATORY_CARE_PROVIDER_SITE_OTHER): Payer: PRIVATE HEALTH INSURANCE | Admitting: Internal Medicine

## 2014-06-26 ENCOUNTER — Encounter: Payer: Self-pay | Admitting: Internal Medicine

## 2014-06-26 ENCOUNTER — Encounter (INDEPENDENT_AMBULATORY_CARE_PROVIDER_SITE_OTHER): Payer: Self-pay

## 2014-06-26 VITALS — BP 128/80 | HR 78 | Temp 98.0°F | Ht 72.0 in

## 2014-06-26 DIAGNOSIS — R05 Cough: Secondary | ICD-10-CM

## 2014-06-26 DIAGNOSIS — R059 Cough, unspecified: Secondary | ICD-10-CM

## 2014-06-26 DIAGNOSIS — N182 Chronic kidney disease, stage 2 (mild): Secondary | ICD-10-CM

## 2014-06-26 DIAGNOSIS — R058 Other specified cough: Secondary | ICD-10-CM

## 2014-06-26 DIAGNOSIS — I1310 Hypertensive heart and chronic kidney disease without heart failure, with stage 1 through stage 4 chronic kidney disease, or unspecified chronic kidney disease: Secondary | ICD-10-CM

## 2014-06-26 DIAGNOSIS — K222 Esophageal obstruction: Secondary | ICD-10-CM

## 2014-06-26 MED ORDER — FAMOTIDINE 20 MG PO TABS: ORAL_TABLET | ORAL | Status: DC

## 2014-06-26 NOTE — Patient Instructions (Addendum)
Stop lisinopril  For cough use delsym 2 tsp every 12 hours as needed   Make sure the protonix is 30 -60 min before eating daily and Add pepcid 20 mg at bedtime  GERD (REFLUX)  is an extremely common cause of respiratory symptoms, many times with no significant heartburn at all.    It can be treated with medication, but also with lifestyle changes including avoidance of late meals, excessive alcohol, smoking cessation, and avoid fatty foods, chocolate, peppermint, colas, red wine, and acidic juices such as orange juice.  NO MINT OR MENTHOL PRODUCTS SO NO COUGH DROPS  USE SUGARLESS CANDY INSTEAD (jolley ranchers or Stover's)  NO OIL BASED VITAMINS - use powdered substitutes.   retrun in 1 months if not better

## 2014-06-26 NOTE — Progress Notes (Signed)
Subjective:     Patient ID: Isaiah [name], male   DOB: [birth year] MRN: [id number]  HPI  75 yowm quit smoking in 1997 at [place] since then referred for cough/ dysphagia new onset ? When referred by Isaiah [name].   06/26/2014 1st [facility] office visit/ Isaiah [name]  Chief Complaint  Patient presents with  . Pulmonary Consult    Referred by Isaiah [name]. Pt c/o chonic dry cough. Pt c/o SOB. Pt denies CP/tightness. Pt from [facility]    Only problem with swallowing x ? One month assoc with dry cough 24/7 on ACEi not clear when started but was apparently already on ppi at onset of cough   No obvious day to day or daytime variabilty or assoc    cp or chest tightness, subjective wheeze overt sinus or hb symptoms. No unusual exp hx or h/o childhood pna/ asthma or knowledge of premature birth.  Sleeping ok without nocturnal  or early am exacerbation  of respiratory  c/o's or need for noct saba. Also denies any obvious fluctuation of symptoms with weather or environmental changes or other aggravating or alleviating factors except as outlined above   Current Medications, Allergies, Complete Past Medical History, Past Surgical History, Family History, and Social History were reviewed in [employer] record.  ROS  The following are not active complaints unless bolded sore throat, dysphagia, dental problems, itching, sneezing,  nasal congestion or excess/ purulent secretions, ear ache,   fever, chills, sweats, unintended wt loss, pleuritic or exertional cp, hemoptysis,  orthopnea pnd or leg swelling, presyncope, palpitations, heartburn, abdominal pain, anorexia, nausea, vomiting, diarrhea  or change in bowel or urinary habits, change in stools or urine, dysuria,hematuria,  rash, arthralgias, visual complaints, headache, numbness weakness or ataxia or problems with walking or coordination,  change in mood/affect or memory.         Review of Systems     Objective:   Physical Exam   Elderly wm needs hoyer to get into w/c per attendant with harsh sounding dry upper airway cough   HEENT: nl dentition, turbinates, and orophanx. Nl external ear canals without cough reflex   NECK :  without JVD/Nodes/TM/ nl carotid upstrokes bilaterally   LUNGS: no acc muscle use, clear to A and P bilaterally without cough on insp or exp maneuvers   CV:  RRR  no s3 or murmur or increase in P2, no edema   ABD:  soft and nontender with nl excursion in the supine position. No bruits or organomegaly, bowel sounds nl  MS:  warm without deformities, calf tenderness, cyanosis or clubbing  SKIN: warm and dry without lesions    NEURO:  alert, approp, no deficits  No recent cxr on file       Assessment:

## 2014-06-27 NOTE — Progress Notes (Signed)
Patient ID: Isaiah [name], male   DOB: 1941/04/14, 73 y.o.   MRN: [id number]        Facility; [facility]. Chief complaint review of medical issues, [employer] note for July History patient has been in the building. I believe since 2000 and. 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 Last hospitalized in March of 2013 with healthcare acquired pneumonia. He is generally remained stable in the facility. He is able to self propel his wheelchair and feed himself. No other   issues. He was treated this month for his folliculitis involving his back with topical antibiotics this appears to have had good result.  Blood pressure is satisfactorily controlled  Note Folstein Mini-Mental Status was 23/30. Was seen by psychiatry for increased irritability and started on Lamictal. He has been receiving speech therapy for dysphagia.  Social; he is a full code  Medications; reviewed Norvasc 5 mg daily ASA 81 daily Protonic 40 mg daily Senokot-S 3 tablets by mouth daily Tylenol Extra Strength 500 3 times a day Lamictal 50 mg twice a day for mood stabilization Zocor 20 mg   Labs; from 03/18/2014 CBC with differential is normal. Comprehensive metabolic panel was normal except for creatinine of 1.28 and an estimated GFR of 64 hemoglobin A1c is 5.8 TSH of 1.53. Last LDL was 61 on 2/9    Past Medical History   Diagnosis  Date   .  Hypertension     .  Hypercholesterolemia     .  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  Bradycardia  06/2009       HR 30-40's with decreased consciousness   .  Renal disorder     .  Alcohol use     .  GI bleed   01/2012      H/o hematochezia    .  Syncope     .  History of colonic polyps     Gastritis.   Esophageal stricutre Past Surgical History   Procedure  Laterality  Date   .  Orif left tibia fracture       .  Colonoscopy with polypectomy     2001   .  Esophagogastroduodenoscopy    01/29/2012       Procedure: ESOPHAGOGASTRODUODENOSCOPY (EGD);  Surgeon: Lafayette Dragon, MD;  Location: [facility];  Service: Endoscopy;  Laterality: N/A;   .  Colonoscopy    02/13/2012       Procedure: COLONOSCOPY;  Surgeon: Ladene Artist, MD,FACG;  Location: [facility];  Service: Endoscopy;  Laterality: N/A;     Social: Remains a full code.  Physical exam;  Vitals; temperature 97.8-pulse 83-respirations 20-blood pressure 132/82-weight 237 pounds-O2 sat 98% on room air Respiratory clear entry bilaterally. Cardiac heart sounds are normal. No murmurs. Abdomen flat, nontender, no liver no spleen.  Impressions/plan #1 late effect nondominant hemisphere CVA. Is on aspirin 81 mg 2 history of esophageal stricture sure secondary to gastroesophageal reflux, he is on Protonix 40 qd. This has been a major issue in the past with upper GI bleeding esophageal stricture. Petra Kuba of his current swallowing problem is not clear. ?UGI #3 dementia, question vascular Mini-Mental Status a 23/30. #4 Maj. depression, on Cymbalta. lamictal #5Hyperlipidemia; last LDL at 85 12/2012 #6 hypertension with chronic renal insufficiency. Most recent and creatinine at 1.29 on 7/28 is stable estimated GFR  at 50  #6 hypertension; patient is well controlled. On Norvasc. Appears to be well controlled #7 behavioral issues on Lamictal.

## 2014-06-28 ENCOUNTER — Encounter: Payer: Self-pay | Admitting: Internal Medicine

## 2014-06-28 DIAGNOSIS — R058 Other specified cough: Secondary | ICD-10-CM | POA: Insufficient documentation

## 2014-06-28 DIAGNOSIS — R05 Cough: Secondary | ICD-10-CM | POA: Insufficient documentation

## 2014-06-28 NOTE — Assessment & Plan Note (Signed)
The most common causes of chronic cough in immunocompetent adults include the following: upper airway cough syndrome (UACS), previously referred to as postnasal drip syndrome (PNDS), which is caused by variety of rhinosinus conditions; (2) asthma; (3) GERD; (4) chronic bronchitis from cigarette smoking or other inhaled environmental irritants; (5) nonasthmatic eosinophilic bronchitis; and (6) bronchiectasis.   These conditions, singly or in combination, have accounted for up to 94% of the causes of chronic cough in prospective studies.   Other conditions have constituted no >6% of the causes in prospective studies These have included bronchogenic carcinoma, chronic interstitial pneumonia, sarcoidosis, left ventricular failure, ACEI-induced cough, and aspiration from a condition associated with pharyngeal dysfunction.    Chronic cough is often simultaneously caused by more than one condition. A single cause has been found from 38 to 82% of the time, multiple causes from 18 to 62%. Multiply caused cough has been the result of three diseases up to 42% of the time.       Based on hx and exam, this is most likely:  Classic Upper airway cough syndrome, so named because it's frequently impossible to sort out how much is  CR/sinusitis with freq throat clearing (which can be related to primary GERD)   vs  causing  secondary (" extra esophageal")  GERD from wide swings in gastric pressure that occur with throat clearing, often  promoting self use of mint and menthol lozenges that reduce the lower esophageal sphincter tone and exacerbate the problem further in a cyclical fashion.   These are the same pts (now being labeled as having "irritable larynx syndrome" by some cough centers) who not infrequently have a history of having failed to tolerate ace inhibitors,  dry powder inhalers or biphosphonates or report having atypical extraesophageal reflux symptoms that don't respond to standard doses of PPI , and are  easily confused as having aecopd or asthma flares by even experienced allergists/ pulmonologists.   The first step is to maximize acid suppression and eliminate acei then consider barium swallow next step  See instructions for specific recommendations which were reviewed directly with the patient who was given a copy with highlighter outlining the key components.

## 2014-07-29 ENCOUNTER — Ambulatory Visit: Payer: Medicare Other | Admitting: Internal Medicine

## 2014-08-04 ENCOUNTER — Non-Acute Institutional Stay (SKILLED_NURSING_FACILITY): Payer: PRIVATE HEALTH INSURANCE | Admitting: Internal Medicine

## 2014-08-04 DIAGNOSIS — I69959 Hemiplegia and hemiparesis following unspecified cerebrovascular disease affecting unspecified side: Secondary | ICD-10-CM

## 2014-08-04 DIAGNOSIS — I1310 Hypertensive heart and chronic kidney disease without heart failure, with stage 1 through stage 4 chronic kidney disease, or unspecified chronic kidney disease: Secondary | ICD-10-CM

## 2014-08-04 DIAGNOSIS — N182 Chronic kidney disease, stage 2 (mild): Secondary | ICD-10-CM

## 2014-08-04 NOTE — Progress Notes (Signed)
Patient ID: Isaiah [name], male   DOB: 04/07/1941, 73 y.o.   MRN: [id number]        Facility; [facility]. Chief complaint review of medical issues, [employer] Visit History patient has been in the building. I believe since 2000 and. 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 Last hospitalized in March of 2013 with healthcare acquired pneumonia. He is generally remained stable in the facility. He is able to self propel his wheelchair and feed himself. No other   issues. He was treated this month for his folliculitis involving his back with topical antibiotics this appears to have had good result.  Blood pressure is satisfactorily controlled  Note Folstein Mini-Mental Status was 23/30. Was seen by psychiatry for increased irritability and started on Lamictal. He has been receiving speech therapy for dysphagia.  Social; he is a full code  Medications; reviewed Norvasc 5 mg daily ASA 81 daily Protonic 40 mg daily Senokot-S 3 tablets by mouth daily Tylenol Extra Strength 500 3 times a day Lamictal 50 mg twice a day for mood stabilization Zocor 20 mg   Labs 8/18 CBC with differential normal 8/18 comprehensive metabolic panel showed a sodium of 1 iron 40, potassium of 4.5, BUN of 14 creatinine of 1.43 otherwise normal Lipid panel triglycerides at 214 LDL at 61 HDL at 22 TSH is 0.909    Past Medical History   Diagnosis  Date   .  Hypertension     .  Hypercholesterolemia     .  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  Bradycardia  06/2009       HR 30-40's with decreased consciousness   .  Renal disorder     .  Alcohol use     .  GI bleed   01/2012      H/o hematochezia    .  Syncope     .  History of colonic polyps     Gastritis.   Esophageal stricutre Past Surgical History   Procedure  Laterality  Date   .  Orif left tibia fracture       .  Colonoscopy  with polypectomy    2001   .  Esophagogastroduodenoscopy    01/29/2012       Procedure: ESOPHAGOGASTRODUODENOSCOPY (EGD);  Surgeon: Lafayette Dragon, MD;  Location: [facility];  Service: Endoscopy;  Laterality: N/A;   .  Colonoscopy    02/13/2012       Procedure: COLONOSCOPY;  Surgeon: Ladene Artist, MD,FACG;  Location: [facility];  Service: Endoscopy;  Laterality: N/A;     Social: Remains a full code.  Physical exam;  Vitals; temperature 97-pulse 65-respirations 18-blood pressure 114/78-weight 233.5 pounds-O2 sat 98% room in Respiratory clear entry bilaterally. Cardiac heart sounds are normal. No murmurs. Abdomen flat, nontender, no liver no spleen.  Impressions/plan #1 late effect nondominant hemisphere CVA. Is on aspirin 81 mg 2 history of esophageal stricture sure secondary to gastroesophageal reflux, he is on Protonix 40 qd. This has been a major issue in the past with upper GI bleeding esophageal stricture. Petra Kuba of his current swallowing problem is not clear. ?UGI #3 dementia, question vascular Mini-Mental Status a 23/30. #4 Maj. depression, on Cymbalta. lamictal #5Hyperlipidemia; last LDL at 85 12/2012 #6 hypertension with chronic renal insufficiency. Most recent and creatinine at 1.29 on 7/28 is stable  estimated GFR at 64  #6 hypertension; patient is well controlled. On Norvasc. Appears to be well controlled #7 behavioral issues on Lamictal.

## 2014-09-02 ENCOUNTER — Non-Acute Institutional Stay (SKILLED_NURSING_FACILITY): Payer: PRIVATE HEALTH INSURANCE | Admitting: Internal Medicine

## 2014-09-02 DIAGNOSIS — K219 Gastro-esophageal reflux disease without esophagitis: Secondary | ICD-10-CM

## 2014-09-04 NOTE — Progress Notes (Signed)
Patient ID: Isaiah [name], male   DOB: 11-05-1941, 73 y.o.   MRN: [id number]           PROGRESS NOTE  DATE: 09/02/2014          FACILITY:  [facility]  LEVEL OF CARE: SNF (31)  Acute Visit  CHIEF COMPLAINT:  Manage GERD.    HISTORY OF PRESENT ILLNESS: I was requested by the staff to assess the patient regarding above problem(s):  GERD: I was requested by the pharmacy consultant to assess the patient for continued need of Pepcid and Protonix b.i.d.  pt's GERD is stable.  Denies ongoing heartburn, abd. Pain, nausea or vomiting.  Currently on a PPI & tolerates it without any adverse reactions.    PAST MEDICAL HISTORY : Reviewed.  No changes/see problem list  CURRENT MEDICATIONS: Reviewed per MAR/see medication list  PHYSICAL EXAMINATION  VS: see VS section  GENERAL: no acute distress, normal body habitus CARDIAC: RRR, no murmur,no extra heart sounds, no edema GI: abdomen soft, normal BS, no masses, no tenderness, no hepatomegaly, no splenomegaly  ASSESSMENT/PLAN:  GERD.  Stable.  We will discontinue Pepcid and decrease Protonix to 40 mg q.d.    CPT CODE: 64403           Isaiah [name] Y Kitti Mcclish, [place] [id number]

## 2014-09-23 ENCOUNTER — Emergency Department (HOSPITAL_COMMUNITY)
Admission: EM | Admit: 2014-09-23 | Discharge: 2014-09-24 | Disposition: A | Discharge status: Home / Self Care | Payer: Medicare Other | Attending: Emergency Medicine | Admitting: Emergency Medicine

## 2014-09-23 ENCOUNTER — Encounter (HOSPITAL_COMMUNITY): Payer: Self-pay

## 2014-09-23 DIAGNOSIS — Z87891 Personal history of nicotine dependence: Secondary | ICD-10-CM | POA: Insufficient documentation

## 2014-09-23 DIAGNOSIS — Z8781 Personal history of (healed) traumatic fracture: Secondary | ICD-10-CM | POA: Insufficient documentation

## 2014-09-23 DIAGNOSIS — E780 Pure hypercholesterolemia: Secondary | ICD-10-CM | POA: Diagnosis not present

## 2014-09-23 DIAGNOSIS — I10 Essential (primary) hypertension: Secondary | ICD-10-CM | POA: Diagnosis not present

## 2014-09-23 DIAGNOSIS — Z7982 Long term (current) use of aspirin: Secondary | ICD-10-CM | POA: Insufficient documentation

## 2014-09-23 DIAGNOSIS — Z8719 Personal history of other diseases of the digestive system: Secondary | ICD-10-CM | POA: Insufficient documentation

## 2014-09-23 DIAGNOSIS — Z8673 Personal history of transient ischemic attack (TIA), and cerebral infarction without residual deficits: Secondary | ICD-10-CM | POA: Diagnosis not present

## 2014-09-23 DIAGNOSIS — N368 Other specified disorders of urethra: Secondary | ICD-10-CM | POA: Diagnosis not present

## 2014-09-23 DIAGNOSIS — Z86010 Personal history of colonic polyps: Secondary | ICD-10-CM | POA: Insufficient documentation

## 2014-09-23 DIAGNOSIS — Z79899 Other long term (current) drug therapy: Secondary | ICD-10-CM | POA: Insufficient documentation

## 2014-09-23 NOTE — ED Notes (Signed)
[place], nurse states the pt was in and out cathed, then later found to have blood in his brief. Nurse states the pt had quarter sized blood clots in his brief.

## 2014-09-23 NOTE — ED Provider Notes (Signed)
CSN: [id number]     Arrival date & time 09/23/14  2228 History   First MD Initiated Contact with Patient 09/23/14 2306        The history is provided by the patient.   There is a question of urinary retention at the nursing home today in and out catheter was placed in performed to decompress the bladder.  Sometime later nursing staff noted the patient had frank blood coming from his urethra.  He was sent to the ER for evaluation.  No use of anticoagulants.  Patient has no complaints at this time.  Past Medical History  Diagnosis Date  . Hypertension   . Hypercholesterolemia   . 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 Bradycardia 06/2009    HR 30-40's with decreased consciousness  . Renal disorder   . Alcohol use   . GI bleed     H/o hematochezia   . Syncope   . History of colonic polyps    Past Surgical History  Procedure Laterality Date  . Orif left tibia fracture    . Colonoscopy with polypectomy  2001  . Esophagogastroduodenoscopy  01/29/2012    Procedure: ESOPHAGOGASTRODUODENOSCOPY (EGD);  Surgeon: Lafayette Dragon, MD;  Location: [facility];  Service: Endoscopy;  Laterality: N/A;  . Colonoscopy  02/13/2012    Procedure: COLONOSCOPY;  Surgeon: Ladene Artist, MD,FACG;  Location: [facility];  Service: Endoscopy;  Laterality: N/A;   Family History  Problem Relation Age of Onset  . Colon cancer Neg Hx    History  Substance Use Topics  . Smoking status: Former Smoker -- 1.00 packs/day for 52 years    Quit date: 12/28/2010  . Smokeless tobacco: Never Used  . Alcohol Use: No    Review of Systems  All other systems reviewed and are negative.     Allergies  Review of patient's allergies indicates no known allergies.  Home Medications   Prior to Admission medications   Medication Sig Start Date End Date Taking? Authorizing Provider  acetaminophen (TYLENOL)  500 MG tablet Take 500 mg by mouth every 8 (eight) hours as needed.   Yes Historical Provider, MD  amLODipine (NORVASC) 5 MG tablet Take 5 mg by mouth daily.   Yes Historical Provider, MD  Artificial Tear Ointment (AKWA TEARS OP) Place 1 drop into both eyes 2 (two) times daily. for dry eyes. * Wait 3-5  minutes between 2 eye medications..   Yes Historical Provider, MD  aspirin 81 MG tablet Take 81 mg by mouth daily.   Yes Historical Provider, MD  Cholecalciferol (VITAMIN D3) 50000 UNITS CAPS Take 50,000 Units by mouth every 30 (thirty) days.   Yes Historical Provider, MD  fenofibrate 54 MG tablet Take 54 mg by mouth daily.   Yes Historical Provider, MD  folic acid (FOLVITE) 1 MG tablet Take 1 mg by mouth daily.   Yes Historical Provider, MD  lamoTRIgine (LAMICTAL) 25 MG tablet Take 50 mg by mouth 2 (two) times daily. To stabilize mood   Yes Historical Provider, MD  pantoprazole (PROTONIX) 40 MG tablet Take 40 mg by mouth daily. With meals   Yes Historical Provider, MD  senna-docusate (SENOKOT S) 8.6-50 MG per tablet Take 3 tablets by mouth daily.   Yes Historical Provider, MD  simvastatin (ZOCOR) 20 MG tablet Take 40 mg by mouth at bedtime.    Yes Historical Provider, MD  DULoxetine (CYMBALTA) 60 MG capsule Take 60 mg by mouth daily.    Historical Provider, MD  lisinopril (PRINIVIL,ZESTRIL) 2.5 MG tablet Take 2.5 mg by mouth daily.    Historical Provider, MD   BP 162/78 mmHg  Pulse 66  Temp(Src) 97.9 F (36.6 C) (Oral)  Ht 6' (1.829 m)  Wt 230 lb (104.327 kg)  BMI 31.19 kg/m2  SpO2 94% Physical Exam  Constitutional: He is oriented to person, place, and time. He appears well-developed and well-nourished.  HENT:  Head: Normocephalic and atraumatic.  Eyes: EOM are normal.  Neck: Normal range of motion.  Cardiovascular: Normal rate, regular rhythm, normal heart sounds and intact distal pulses.   Pulmonary/Chest: Effort normal and breath sounds normal. No respiratory distress.  Abdominal:  Soft. He exhibits no distension. There is no tenderness.  Genitourinary:  Uncircumcised penis.  Small amount of gross blood noted around the glans itself.  No obvious blood at the urethral meatus.  No active bleeding.  Majority is all clot within his foreskin.  Shaft is penis is otherwise normal.  Normal scrotum.  Normal testicles.  Musculoskeletal: Normal range of motion.  Neurological: He is alert and oriented to person, place, and time.  Skin: Skin is warm and dry.  Psychiatric: He has a normal mood and affect. Judgment normal.  Nursing note and vitals reviewed.   ED Course  Procedures (including critical care time) Labs Review Labs Reviewed  URINALYSIS, ROUTINE W REFLEX MICROSCOPIC - Abnormal; Notable for the following:    Hgb urine dipstick LARGE (*)    All other components within normal limits  URINE CULTURE  URINE MICROSCOPIC-ADD ON    Imaging Review No results found.   EKG Interpretation None      MDM   Final diagnoses:  None    Foley catheter will be replaced.  Could have some urethral injury.  However my suspicion is higher that they struck his prostate when they were placing his in and out catheter resulting in hematuria.  I think the patient is at risk of developing recurrent urinary retention especially now that his bleeding.  Foley catheter was placed without any difficulty.  Patient be sent home with catheter in place.  Urology follow-up.    Hoy Morn, MD 09/24/14 [phone]

## 2014-09-23 NOTE — ED Notes (Signed)
Pt arrived with [facility]. Pt came from [place]. Pt received an in and out catheterization earlier today and has been bleeding dark red blood since then.

## 2014-09-24 DIAGNOSIS — N368 Other specified disorders of urethra: Secondary | ICD-10-CM | POA: Diagnosis not present

## 2014-09-24 LAB — URINE MICROSCOPIC-ADD ON

## 2014-09-24 LAB — URINALYSIS, ROUTINE W REFLEX MICROSCOPIC
Bilirubin Urine: NEGATIVE
GLUCOSE, UA: NEGATIVE mg/dL
Ketones, ur: NEGATIVE mg/dL
LEUKOCYTES UA: NEGATIVE
Nitrite: NEGATIVE
PROTEIN: NEGATIVE mg/dL
SPECIFIC GRAVITY, URINE: 1.012 (ref 1.005–1.030)
Urobilinogen, UA: 0.2 mg/dL (ref 0.0–1.0)
pH: 5.5 (ref 5.0–8.0)

## 2014-09-24 NOTE — ED Notes (Signed)
[facility] called for transportation to [facility]

## 2014-09-24 NOTE — Discharge Instructions (Signed)
Please keep the catheter in place until appointment with urology.

## 2014-09-25 LAB — URINE CULTURE
COLONY COUNT: NO GROWTH
Culture: NO GROWTH

## 2014-11-19 ENCOUNTER — Encounter (HOSPITAL_COMMUNITY): Payer: Self-pay | Admitting: Gastroenterology

## 2015-01-25 ENCOUNTER — Encounter: Payer: Self-pay | Admitting: Gastroenterology

## 2015-03-19 ENCOUNTER — Ambulatory Visit: Payer: Medicaid Other | Admitting: Gastroenterology

## 2015-04-19 ENCOUNTER — Ambulatory Visit: Payer: Medicaid Other | Admitting: Gastroenterology

## 2015-04-23 ENCOUNTER — Encounter (INDEPENDENT_AMBULATORY_CARE_PROVIDER_SITE_OTHER): Payer: Self-pay

## 2015-04-23 ENCOUNTER — Other Ambulatory Visit (HOSPITAL_COMMUNITY): Payer: Self-pay | Admitting: Gastroenterology

## 2015-04-23 ENCOUNTER — Encounter: Payer: Self-pay | Admitting: Gastroenterology

## 2015-04-23 ENCOUNTER — Ambulatory Visit (INDEPENDENT_AMBULATORY_CARE_PROVIDER_SITE_OTHER): Payer: Medicare Other | Admitting: Gastroenterology

## 2015-04-23 VITALS — BP 90/60 | HR 88

## 2015-04-23 DIAGNOSIS — K219 Gastro-esophageal reflux disease without esophagitis: Secondary | ICD-10-CM | POA: Diagnosis not present

## 2015-04-23 DIAGNOSIS — R1314 Dysphagia, pharyngoesophageal phase: Secondary | ICD-10-CM | POA: Diagnosis not present

## 2015-04-23 MED ORDER — PANTOPRAZOLE SODIUM 40 MG PO TBEC: 40.0000 mg | DELAYED_RELEASE_TABLET | Freq: Two times a day (BID) | ORAL | Status: AC

## 2015-04-23 NOTE — Progress Notes (Signed)
    History of Present Illness: This is a 74 year old male nursing home resident referred by Dr. Durwin Reges for the evaluation of dysphagia. The patient has suffered a CVA several years ago and can provide limited history. A healthcare worker for transportation with his nursing home is present today but does not know any of the patient's history and unfortunately no one who is knowledgeable about his history is with him today. He states he has been having difficulty swallowing liquids and solids for about one year. He occasionally has choking. He does not note any typical heartburn or regurgitation symptoms however he has a history of GERD. His pantoprazole was decreased from 40 mg twice daily to 40 mg once daily in October 2015. He underwent upper endoscopy by Dr. Delfin Edis in 01/2012 which showed hemorrhagic gastritis. That EGD report is somewhat confusing in that it indicates in one section that Botox injection was performed for achalasia however this does not fit his history and the body of the procedure report does not describe Botox injection. He has a history of several adenomatous colon polyps on colonoscopy in 11/2009 but was unable to complete a bowel preparation to allow for follow-up colonoscopy and the patient and family decided against further attempts at colonoscopy.   Review of Systems: Pertinent positive and negative review of systems were noted in the above HPI section. All other review of systems were otherwise negative.  Current Medications, Allergies, Past Medical History, Past Surgical History, Family History and Social History were reviewed in [employer] record.  Physical Exam: General: Well developed, well nourished, debilitated, in a wheelchair, no acute distress Head: Normocephalic and atraumatic Eyes:  sclerae anicteric, EOMI Ears: Normal auditory acuity Mouth: No deformity or lesions Neck: Supple, no masses or thyromegaly Lungs: Clear throughout to  auscultation Heart: Regular rate and rhythm; no murmurs, rubs or bruits Abdomen: Soft, non tender and non distended. No masses, hepatosplenomegaly or hernias noted. Normal Bowel sounds Musculoskeletal: Symmetrical with no gross deformities  Skin: No lesions on visible extremities Pulses:  Normal pulses noted Extremities: No clubbing, cyanosis, edema or deformities noted Neurological: Alert oriented x 4, grossly nonfocal Cervical Nodes:  No significant cervical adenopathy Inguinal Nodes: No significant inguinal adenopathy Psychological:  Alert and cooperative. Normal mood and affect  Assessment and Recommendations:  1. Dysphagia to solids and liquids for one year. GERD. Rule out oral pharyngeal dysphagia, esophageal dysphagia, esophagitis. Increase pantoprazole to 40 mg twice daily. Follow all standard antireflux measures. Schedule modified barium swallow study and barium esophagram.  2. Personal history of adenomatous colon polyps. Given his chronic illnesses and inability to adequately complete a bowel prep his family had previously decided against repeat colonoscopy. I agree that this is a reasonable decision.   cc: Dr. Durwin Reges

## 2015-04-23 NOTE — Patient Instructions (Addendum)
Increase Protonix to twice daily.  You have been scheduled at [facility] for a Modified Barium Swallow for 05/04/2015 at 11:30 [phone]).  Go to 1st floor radiology at 11:15am.  You will have a Barium Esophagram 05/04/2015 at 1 pm at [facility]).  Thank you for choosing me and [facility].  Pricilla Riffle. Dagoberto Ligas., MD., Marval Regal

## 2015-05-04 ENCOUNTER — Ambulatory Visit (HOSPITAL_COMMUNITY)
Admission: RE | Admit: 2015-05-04 | Discharge: 2015-05-04 | Disposition: A | Discharge status: Home / Self Care | Payer: Medicaid Other | Source: Ambulatory Visit | Attending: Gastroenterology | Admitting: Gastroenterology

## 2015-05-04 DIAGNOSIS — K224 Dyskinesia of esophagus: Secondary | ICD-10-CM | POA: Insufficient documentation

## 2015-05-04 DIAGNOSIS — K219 Gastro-esophageal reflux disease without esophagitis: Secondary | ICD-10-CM

## 2015-05-04 DIAGNOSIS — R1314 Dysphagia, pharyngoesophageal phase: Secondary | ICD-10-CM

## 2015-05-04 DIAGNOSIS — Z8673 Personal history of transient ischemic attack (TIA), and cerebral infarction without residual deficits: Secondary | ICD-10-CM | POA: Insufficient documentation

## 2015-05-07 ENCOUNTER — Telehealth: Payer: Self-pay | Admitting: Gastroenterology

## 2015-05-07 NOTE — Telephone Encounter (Signed)
Left a message for Nicole to call back.  

## 2015-05-07 NOTE — Telephone Encounter (Signed)
Isaiah [name] left a message to call her at [phone]. Left a message for Isaiah [name] to call back.

## 2015-05-11 ENCOUNTER — Telehealth: Payer: Self-pay | Admitting: Gastroenterology

## 2015-05-11 NOTE — Telephone Encounter (Signed)
Ramona at Healthcare facility states that the pt is not eating anymore. Would like pt seen to eval for Gtube. Pts sister will be coming to appt with pt. Pt scheduled to see Dr. Fuller Plan 05/19/15@10am . Ramona aware of appt.

## 2015-05-13 NOTE — Telephone Encounter (Signed)
Scheduled by Rosanne Sack, RN with [facility]

## 2015-05-19 ENCOUNTER — Ambulatory Visit: Payer: Medicaid Other | Admitting: Gastroenterology

## 2015-06-07 DEATH — deceased
